# Patient Record
Sex: Male | Born: 1940 | Race: White | Hispanic: No | Smoking: Never smoker
Health system: Southern US, Community
[De-identification: ages and names within clinical notes are randomized; demographics above are authoritative.]

## PROBLEM LIST (undated history)

## (undated) DIAGNOSIS — C801 Malignant (primary) neoplasm, unspecified: Secondary | ICD-10-CM

## (undated) DIAGNOSIS — M1990 Unspecified osteoarthritis, unspecified site: Secondary | ICD-10-CM

## (undated) DIAGNOSIS — I10 Essential (primary) hypertension: Secondary | ICD-10-CM

## (undated) DIAGNOSIS — J189 Pneumonia, unspecified organism: Secondary | ICD-10-CM

## (undated) DIAGNOSIS — R7303 Prediabetes: Secondary | ICD-10-CM

## (undated) HISTORY — PX: CHOLECYSTECTOMY: SHX55

## (undated) HISTORY — PX: KNEE ARTHROPLASTY: SHX992

---

## 2020-01-25 ENCOUNTER — Ambulatory Visit: Payer: Medicare PPO | Attending: Internal Medicine

## 2020-01-25 DIAGNOSIS — Z23 Encounter for immunization: Secondary | ICD-10-CM

## 2020-01-25 NOTE — Progress Notes (Signed)
   Covid-19 Vaccination Clinic  Name:  Mark Hall    MRN: [id number] DOB: 05-13-1941  01/25/2020  Mark Hall was observed post Covid-19 immunization for 15 minutes without incidence. He was provided with Vaccine Information Sheet and instruction to access the V-Safe system.   Mark Hall was instructed to call 911 with any severe reactions post vaccine: Marland Kitchen Difficulty breathing  . Swelling of your face and throat  . A fast heartbeat  . A bad rash all over your body  . Dizziness and weakness    Immunizations Administered    Name Date Dose VIS Date Route   Pfizer COVID-19 Vaccine 01/25/2020 10:04 AM 0.3 mL 12/11/2019 Intramuscular   Manufacturer: [place]   Lot: [id number]   [place]: [id number]

## 2020-02-15 ENCOUNTER — Ambulatory Visit: Payer: Medicare PPO | Attending: Internal Medicine

## 2020-02-15 DIAGNOSIS — Z23 Encounter for immunization: Secondary | ICD-10-CM | POA: Insufficient documentation

## 2020-02-15 NOTE — Progress Notes (Signed)
   Covid-19 Vaccination Clinic  Name:  [name]    MRN: [id number] DOB: 01-09-1941  02/15/2020  [name] was observed post Covid-19 immunization for 15 minutes without incidence. He was provided with Vaccine Information Sheet and instruction to access the V-Safe system.   [name] was instructed to call 911 with any severe reactions post vaccine: Marland Kitchen Difficulty breathing  . Swelling of your face and throat  . A fast heartbeat  . A bad rash all over your body  . Dizziness and weakness    Immunizations Administered    Name Date Dose VIS Date Route   Pfizer COVID-19 Vaccine 02/15/2020 10:21 AM 0.3 mL 12/11/2019 Intramuscular   Manufacturer: [place]   Lot: [id number]   [place]: [id number]

## 2020-07-24 DIAGNOSIS — R4182 Altered mental status, unspecified: Secondary | ICD-10-CM | POA: Diagnosis not present

## 2020-07-24 DIAGNOSIS — J189 Pneumonia, unspecified organism: Secondary | ICD-10-CM | POA: Diagnosis not present

## 2020-07-24 DIAGNOSIS — N3001 Acute cystitis with hematuria: Secondary | ICD-10-CM | POA: Diagnosis not present

## 2020-07-24 DIAGNOSIS — R509 Fever, unspecified: Secondary | ICD-10-CM | POA: Diagnosis not present

## 2020-07-24 DIAGNOSIS — E872 Acidosis: Secondary | ICD-10-CM | POA: Diagnosis not present

## 2020-07-24 DIAGNOSIS — J168 Pneumonia due to other specified infectious organisms: Secondary | ICD-10-CM | POA: Diagnosis not present

## 2020-07-24 DIAGNOSIS — I350 Nonrheumatic aortic (valve) stenosis: Secondary | ICD-10-CM | POA: Diagnosis not present

## 2020-07-24 DIAGNOSIS — J9601 Acute respiratory failure with hypoxia: Secondary | ICD-10-CM | POA: Diagnosis not present

## 2020-07-24 DIAGNOSIS — A419 Sepsis, unspecified organism: Secondary | ICD-10-CM | POA: Diagnosis not present

## 2020-07-24 DIAGNOSIS — R739 Hyperglycemia, unspecified: Secondary | ICD-10-CM | POA: Diagnosis not present

## 2020-07-24 DIAGNOSIS — E7800 Pure hypercholesterolemia, unspecified: Secondary | ICD-10-CM | POA: Diagnosis not present

## 2020-07-24 DIAGNOSIS — R410 Disorientation, unspecified: Secondary | ICD-10-CM | POA: Diagnosis not present

## 2020-07-24 DIAGNOSIS — Z03818 Encounter for observation for suspected exposure to other biological agents ruled out: Secondary | ICD-10-CM | POA: Diagnosis not present

## 2020-07-24 DIAGNOSIS — E785 Hyperlipidemia, unspecified: Secondary | ICD-10-CM | POA: Diagnosis not present

## 2020-07-24 DIAGNOSIS — R05 Cough: Secondary | ICD-10-CM | POA: Diagnosis not present

## 2020-07-24 DIAGNOSIS — R011 Cardiac murmur, unspecified: Secondary | ICD-10-CM | POA: Diagnosis not present

## 2020-07-24 DIAGNOSIS — R791 Abnormal coagulation profile: Secondary | ICD-10-CM | POA: Diagnosis not present

## 2020-07-24 DIAGNOSIS — R42 Dizziness and giddiness: Secondary | ICD-10-CM | POA: Diagnosis not present

## 2020-07-24 DIAGNOSIS — Z20822 Contact with and (suspected) exposure to covid-19: Secondary | ICD-10-CM | POA: Diagnosis not present

## 2020-07-24 DIAGNOSIS — I060 Rheumatic aortic stenosis: Secondary | ICD-10-CM | POA: Diagnosis not present

## 2020-08-08 DIAGNOSIS — J189 Pneumonia, unspecified organism: Secondary | ICD-10-CM | POA: Diagnosis not present

## 2020-08-08 DIAGNOSIS — R718 Other abnormality of red blood cells: Secondary | ICD-10-CM | POA: Diagnosis not present

## 2020-08-08 DIAGNOSIS — R7303 Prediabetes: Secondary | ICD-10-CM | POA: Diagnosis not present

## 2020-08-08 DIAGNOSIS — Z09 Encounter for follow-up examination after completed treatment for conditions other than malignant neoplasm: Secondary | ICD-10-CM | POA: Diagnosis not present

## 2020-08-08 DIAGNOSIS — D649 Anemia, unspecified: Secondary | ICD-10-CM | POA: Diagnosis not present

## 2020-08-10 DIAGNOSIS — J189 Pneumonia, unspecified organism: Secondary | ICD-10-CM | POA: Diagnosis not present

## 2020-08-10 DIAGNOSIS — D649 Anemia, unspecified: Secondary | ICD-10-CM | POA: Diagnosis not present

## 2020-08-10 DIAGNOSIS — R7303 Prediabetes: Secondary | ICD-10-CM | POA: Diagnosis not present

## 2020-08-10 DIAGNOSIS — R718 Other abnormality of red blood cells: Secondary | ICD-10-CM | POA: Diagnosis not present

## 2020-08-10 DIAGNOSIS — Z09 Encounter for follow-up examination after completed treatment for conditions other than malignant neoplasm: Secondary | ICD-10-CM | POA: Diagnosis not present

## 2020-08-16 DIAGNOSIS — L821 Other seborrheic keratosis: Secondary | ICD-10-CM | POA: Diagnosis not present

## 2020-08-16 DIAGNOSIS — L570 Actinic keratosis: Secondary | ICD-10-CM | POA: Diagnosis not present

## 2020-08-16 DIAGNOSIS — Z85828 Personal history of other malignant neoplasm of skin: Secondary | ICD-10-CM | POA: Diagnosis not present

## 2020-08-16 DIAGNOSIS — L578 Other skin changes due to chronic exposure to nonionizing radiation: Secondary | ICD-10-CM | POA: Diagnosis not present

## 2020-08-24 DIAGNOSIS — Z09 Encounter for follow-up examination after completed treatment for conditions other than malignant neoplasm: Secondary | ICD-10-CM | POA: Diagnosis not present

## 2020-08-24 DIAGNOSIS — J189 Pneumonia, unspecified organism: Secondary | ICD-10-CM | POA: Diagnosis not present

## 2020-09-07 DIAGNOSIS — Z0000 Encounter for general adult medical examination without abnormal findings: Secondary | ICD-10-CM | POA: Diagnosis not present

## 2020-09-07 DIAGNOSIS — N529 Male erectile dysfunction, unspecified: Secondary | ICD-10-CM | POA: Diagnosis not present

## 2020-09-07 DIAGNOSIS — R7303 Prediabetes: Secondary | ICD-10-CM | POA: Diagnosis not present

## 2020-09-07 DIAGNOSIS — Z125 Encounter for screening for malignant neoplasm of prostate: Secondary | ICD-10-CM | POA: Diagnosis not present

## 2020-09-07 DIAGNOSIS — E782 Mixed hyperlipidemia: Secondary | ICD-10-CM | POA: Diagnosis not present

## 2020-12-01 DIAGNOSIS — D0462 Carcinoma in situ of skin of left upper limb, including shoulder: Secondary | ICD-10-CM | POA: Diagnosis not present

## 2020-12-01 DIAGNOSIS — L570 Actinic keratosis: Secondary | ICD-10-CM | POA: Diagnosis not present

## 2021-01-17 DIAGNOSIS — Z86008 Personal history of in-situ neoplasm of other site: Secondary | ICD-10-CM | POA: Diagnosis not present

## 2021-01-17 DIAGNOSIS — W908XXS Exposure to other nonionizing radiation, sequela: Secondary | ICD-10-CM | POA: Diagnosis not present

## 2021-01-17 DIAGNOSIS — L570 Actinic keratosis: Secondary | ICD-10-CM | POA: Diagnosis not present

## 2021-01-17 DIAGNOSIS — L578 Other skin changes due to chronic exposure to nonionizing radiation: Secondary | ICD-10-CM | POA: Diagnosis not present

## 2021-02-09 DIAGNOSIS — S0001XA Abrasion of scalp, initial encounter: Secondary | ICD-10-CM | POA: Diagnosis not present

## 2021-02-09 DIAGNOSIS — M542 Cervicalgia: Secondary | ICD-10-CM | POA: Diagnosis not present

## 2021-02-09 DIAGNOSIS — S0003XA Contusion of scalp, initial encounter: Secondary | ICD-10-CM | POA: Diagnosis not present

## 2021-02-09 DIAGNOSIS — R030 Elevated blood-pressure reading, without diagnosis of hypertension: Secondary | ICD-10-CM | POA: Diagnosis not present

## 2021-02-09 DIAGNOSIS — Z23 Encounter for immunization: Secondary | ICD-10-CM | POA: Diagnosis not present

## 2021-02-09 DIAGNOSIS — S0101XA Laceration without foreign body of scalp, initial encounter: Secondary | ICD-10-CM | POA: Diagnosis not present

## 2021-02-09 DIAGNOSIS — S199XXA Unspecified injury of neck, initial encounter: Secondary | ICD-10-CM | POA: Diagnosis not present

## 2021-02-14 DIAGNOSIS — S0083XD Contusion of other part of head, subsequent encounter: Secondary | ICD-10-CM | POA: Diagnosis not present

## 2021-02-14 DIAGNOSIS — S0990XD Unspecified injury of head, subsequent encounter: Secondary | ICD-10-CM | POA: Diagnosis not present

## 2021-03-07 DIAGNOSIS — L570 Actinic keratosis: Secondary | ICD-10-CM | POA: Diagnosis not present

## 2021-03-17 DIAGNOSIS — H02831 Dermatochalasis of right upper eyelid: Secondary | ICD-10-CM | POA: Diagnosis not present

## 2021-03-17 DIAGNOSIS — H43813 Vitreous degeneration, bilateral: Secondary | ICD-10-CM | POA: Diagnosis not present

## 2021-03-17 DIAGNOSIS — H2513 Age-related nuclear cataract, bilateral: Secondary | ICD-10-CM | POA: Diagnosis not present

## 2021-03-17 DIAGNOSIS — C441022 Unspecified malignant neoplasm of skin of right lower eyelid, including canthus: Secondary | ICD-10-CM | POA: Diagnosis not present

## 2021-05-04 DIAGNOSIS — M5432 Sciatica, left side: Secondary | ICD-10-CM | POA: Diagnosis not present

## 2021-05-18 DIAGNOSIS — Z20822 Contact with and (suspected) exposure to covid-19: Secondary | ICD-10-CM | POA: Diagnosis not present

## 2021-05-30 DIAGNOSIS — R0982 Postnasal drip: Secondary | ICD-10-CM | POA: Diagnosis not present

## 2021-07-06 DIAGNOSIS — C44712 Basal cell carcinoma of skin of right lower limb, including hip: Secondary | ICD-10-CM | POA: Diagnosis not present

## 2021-07-06 DIAGNOSIS — L570 Actinic keratosis: Secondary | ICD-10-CM | POA: Diagnosis not present

## 2021-07-06 DIAGNOSIS — Z85828 Personal history of other malignant neoplasm of skin: Secondary | ICD-10-CM | POA: Diagnosis not present

## 2021-07-06 DIAGNOSIS — L578 Other skin changes due to chronic exposure to nonionizing radiation: Secondary | ICD-10-CM | POA: Diagnosis not present

## 2021-07-06 DIAGNOSIS — Z86008 Personal history of in-situ neoplasm of other site: Secondary | ICD-10-CM | POA: Diagnosis not present

## 2021-07-06 DIAGNOSIS — L821 Other seborrheic keratosis: Secondary | ICD-10-CM | POA: Diagnosis not present

## 2021-07-13 DIAGNOSIS — Z96651 Presence of right artificial knee joint: Secondary | ICD-10-CM | POA: Diagnosis not present

## 2021-07-13 DIAGNOSIS — M1712 Unilateral primary osteoarthritis, left knee: Secondary | ICD-10-CM | POA: Diagnosis not present

## 2021-09-14 DIAGNOSIS — Z23 Encounter for immunization: Secondary | ICD-10-CM | POA: Diagnosis not present

## 2021-09-14 DIAGNOSIS — E782 Mixed hyperlipidemia: Secondary | ICD-10-CM | POA: Diagnosis not present

## 2021-09-14 DIAGNOSIS — R7303 Prediabetes: Secondary | ICD-10-CM | POA: Diagnosis not present

## 2021-09-14 DIAGNOSIS — E559 Vitamin D deficiency, unspecified: Secondary | ICD-10-CM | POA: Diagnosis not present

## 2021-09-14 DIAGNOSIS — Z0000 Encounter for general adult medical examination without abnormal findings: Secondary | ICD-10-CM | POA: Diagnosis not present

## 2021-09-14 DIAGNOSIS — I10 Essential (primary) hypertension: Secondary | ICD-10-CM | POA: Diagnosis not present

## 2021-09-14 DIAGNOSIS — N529 Male erectile dysfunction, unspecified: Secondary | ICD-10-CM | POA: Diagnosis not present

## 2021-09-14 DIAGNOSIS — Z125 Encounter for screening for malignant neoplasm of prostate: Secondary | ICD-10-CM | POA: Diagnosis not present

## 2021-09-20 DIAGNOSIS — R220 Localized swelling, mass and lump, head: Secondary | ICD-10-CM | POA: Diagnosis not present

## 2021-12-12 DIAGNOSIS — M79605 Pain in left leg: Secondary | ICD-10-CM | POA: Diagnosis not present

## 2021-12-13 ENCOUNTER — Ambulatory Visit
Admission: RE | Admit: 2021-12-13 | Discharge: 2021-12-13 | Disposition: A | Discharge status: Home / Self Care | Payer: Medicare PPO | Source: Ambulatory Visit | Attending: Sports Medicine | Admitting: Sports Medicine

## 2021-12-13 ENCOUNTER — Other Ambulatory Visit: Payer: Self-pay | Admitting: Sports Medicine

## 2021-12-13 ENCOUNTER — Other Ambulatory Visit: Payer: Self-pay

## 2021-12-13 DIAGNOSIS — M25552 Pain in left hip: Secondary | ICD-10-CM

## 2021-12-20 DIAGNOSIS — M1612 Unilateral primary osteoarthritis, left hip: Secondary | ICD-10-CM | POA: Diagnosis not present

## 2022-01-08 DIAGNOSIS — M1612 Unilateral primary osteoarthritis, left hip: Secondary | ICD-10-CM | POA: Diagnosis not present

## 2022-01-08 DIAGNOSIS — M1712 Unilateral primary osteoarthritis, left knee: Secondary | ICD-10-CM | POA: Diagnosis not present

## 2022-01-11 DIAGNOSIS — L578 Other skin changes due to chronic exposure to nonionizing radiation: Secondary | ICD-10-CM | POA: Diagnosis not present

## 2022-01-11 DIAGNOSIS — L570 Actinic keratosis: Secondary | ICD-10-CM | POA: Diagnosis not present

## 2022-01-11 DIAGNOSIS — Z85828 Personal history of other malignant neoplasm of skin: Secondary | ICD-10-CM | POA: Diagnosis not present

## 2022-01-11 DIAGNOSIS — L820 Inflamed seborrheic keratosis: Secondary | ICD-10-CM | POA: Diagnosis not present

## 2022-01-11 DIAGNOSIS — L821 Other seborrheic keratosis: Secondary | ICD-10-CM | POA: Diagnosis not present

## 2022-01-11 DIAGNOSIS — Z86008 Personal history of in-situ neoplasm of other site: Secondary | ICD-10-CM | POA: Diagnosis not present

## 2022-01-18 DIAGNOSIS — M1712 Unilateral primary osteoarthritis, left knee: Secondary | ICD-10-CM | POA: Diagnosis not present

## 2022-03-19 DIAGNOSIS — R7303 Prediabetes: Secondary | ICD-10-CM | POA: Diagnosis not present

## 2022-03-19 DIAGNOSIS — M1712 Unilateral primary osteoarthritis, left knee: Secondary | ICD-10-CM | POA: Diagnosis not present

## 2022-03-19 DIAGNOSIS — Z01818 Encounter for other preprocedural examination: Secondary | ICD-10-CM | POA: Diagnosis not present

## 2022-03-19 DIAGNOSIS — M150 Primary generalized (osteo)arthritis: Secondary | ICD-10-CM | POA: Diagnosis not present

## 2022-03-19 DIAGNOSIS — I10 Essential (primary) hypertension: Secondary | ICD-10-CM | POA: Diagnosis not present

## 2022-04-05 DIAGNOSIS — D23112 Other benign neoplasm of skin of right lower eyelid, including canthus: Secondary | ICD-10-CM | POA: Diagnosis not present

## 2022-04-05 DIAGNOSIS — H2513 Age-related nuclear cataract, bilateral: Secondary | ICD-10-CM | POA: Diagnosis not present

## 2022-04-05 DIAGNOSIS — H43813 Vitreous degeneration, bilateral: Secondary | ICD-10-CM | POA: Diagnosis not present

## 2022-04-05 DIAGNOSIS — H02821 Cysts of right upper eyelid: Secondary | ICD-10-CM | POA: Diagnosis not present

## 2022-04-27 ENCOUNTER — Encounter: Payer: Self-pay | Admitting: Orthopedic Surgery

## 2022-04-27 NOTE — H&P (Signed)
?TOTAL KNEE ADMISSION H&P ? ?Patient is being admitted for left total knee arthroplasty. ? ?Subjective: ? ?Chief Complaint: Left knee pain. ? ?HPI: Mark Hall, 81 y.o. male has a history of pain and functional disability in the left knee due to arthritis and has failed non-surgical conservative treatments for greater than 12 weeks to include corticosteriod injections and activity modification. Onset of symptoms was gradual, starting  several  years ago with gradually worsening course since that time. The patient noted no past surgery on the left knee.  Patient currently rates pain in the left knee at 8 out of 10 with activity. Patient has night pain, worsening of pain with activity and weight bearing, pain with passive range of motion, and crepitus. Patient has evidence of bone-on-bone arthritis in the medial compartment and near bone-on-bone arthritis in the patellofemoral compartment by imaging studies. There is no active infection. ? ?There are no problems to display for this patient. ? ? ?History reviewed. No pertinent past medical history. ? ?History reviewed. No pertinent surgical history. ? ?Prior to Admission medications   ?Not on File  ? ? ?Not on File ? ?Social History  ? ?Socioeconomic History  ? Marital status: Unknown  ?  Spouse name: Not on file  ? Number of children: Not on file  ? Years of education: Not on file  ? Highest education level: Not on file  ?Occupational History  ? Not on file  ?Tobacco Use  ? Smoking status: Not on file  ? Smokeless tobacco: Not on file  ?Substance and Sexual Activity  ? Alcohol use: Not on file  ? Drug use: Not on file  ? Sexual activity: Not on file  ?Other Topics Concern  ? Not on file  ?Social History Narrative  ? Not on file  ? ?Social Determinants of Health  ? ?Financial Resource Strain: Not on file  ?Food Insecurity: Not on file  ?Transportation Needs: Not on file  ?Physical Activity: Not on file  ?Stress: Not on file  ?Social Connections: Not on file   ?Intimate Partner Violence: Not on file  ? ? ?Tobacco Use: Not on file  ? ?Social History  ? ?Substance and Sexual Activity  ?Alcohol Use None  ? ? ?History reviewed. No pertinent family history. ? ?Review of Systems  ?Constitutional:  Negative for chills and fever.  ?HENT:  Negative for congestion, sore throat and tinnitus.   ?Eyes:  Negative for double vision, photophobia and pain.  ?Respiratory:  Negative for cough, shortness of breath and wheezing.   ?Cardiovascular:  Negative for chest pain, palpitations and orthopnea.  ?Gastrointestinal:  Negative for heartburn, nausea and vomiting.  ?Genitourinary:  Negative for dysuria, frequency and urgency.  ?Musculoskeletal:  Positive for joint pain.  ?Neurological:  Negative for dizziness, weakness and headaches.  ? ?Objective: ? ?Physical Exam: ?Well nourished and well developed.  ?General: Alert and oriented x3, cooperative and pleasant, no acute distress.  ?Head: normocephalic, atraumatic, neck supple.  ?Eyes: EOMI.  ?Musculoskeletal: ? ?Left Knee Exam:  ? No effusion.  ? Range of motion is 5-115 degrees.  ? Moderate crepitus on range of motion of the knee.  ? Positive medial joint line tenderness.  ? No lateral joint line tenderness.  ? Stable knee. ? ?Calves soft and nontender. Motor function intact in LE. Strength 5/5 LE bilaterally. ?Neuro: Distal pulses 2+. Sensation to light touch intact in LE. ? ?Imaging Review ?Plain radiographs demonstrate severe degenerative joint disease of the left knee. The overall alignment is  neutral. The bone quality appears to be adequate for age and reported activity level. ? ?Assessment/Plan: ? ?End stage arthritis, left knee  ? ?The patient history, physical examination, clinical judgment of the provider and imaging studies are consistent with end stage degenerative joint disease of the left knee and total knee arthroplasty is deemed medically necessary. The treatment options including medical management, injection therapy  arthroscopy and arthroplasty were discussed at length. The risks and benefits of total knee arthroplasty were presented and reviewed. The risks due to aseptic loosening, infection, stiffness, patella tracking problems, thromboembolic complications and other imponderables were discussed. The patient acknowledged the explanation, agreed to proceed with the plan and consent was signed. Patient is being admitted for inpatient treatment for surgery, pain control, PT, OT, prophylactic antibiotics, VTE prophylaxis, progressive ambulation and ADLs and discharge planning. The patient is planning to be discharged  home . ? ? ?Patient's anticipated LOS is less than 2 midnights, meeting these requirements: ?- Lives within 1 hour of care ?- Has a competent adult at home to recover with post-op recover ?- NO history of ? - Chronic pain requiring opioids ? - Diabetes ? - Coronary Artery Disease ? - Heart failure ? - Heart attack ? - Stroke ? - DVT/VTE ? - Cardiac arrhythmia ? - Respiratory Failure/COPD ? - Renal failure ? - Anemia ? - Advanced Liver disease ? ?Therapy Plans: Outpatient therapy at [facility]) ?Disposition: Home with wife ?Planned DVT Prophylaxis: Aspirin 325 mg BID ?DME Needed: None ?PCP: Genelle Bal, [name] (clearance received) ?TXA: IV ?Allergies: Sulfa ?Anesthesia Concerns: None ?BMI: 24.7 ?Last HgbA1c: Not diabetic.  ?Pharmacy: [facility]) ? ? ?- Patient was instructed on what medications to stop prior to surgery. ?- Follow-up visit in 2 weeks with Dr. Wynelle Link ?- Begin physical therapy following surgery ?- Pre-operative lab work as pre-surgical testing ?- Prescriptions will be provided in hospital at time of discharge ? ?Theresa Duty, PA-C ?Orthopedic Surgery ?[facility] [place] ? ? ?

## 2022-05-08 NOTE — Patient Instructions (Signed)
DUE TO COVID-19 ONLY TWO VISITORS  (aged 81 and older)  ARE ALLOWED TO COME WITH YOU AND STAY IN THE WAITING ROOM ONLY DURING PRE OP AND PROCEDURE.   ?**NO VISITORS ARE ALLOWED IN THE SHORT STAY AREA OR RECOVERY ROOM!!** ? ?IF YOU WILL BE ADMITTED INTO THE HOSPITAL YOU ARE ALLOWED ONLY FOUR SUPPORT PEOPLE DURING VISITATION HOURS ONLY (7 AM -8PM)   ?The support person(s) must pass our screening, gel in and out, and wear a mask at all times, including in the patient?s room. ?Patients must also wear a mask when staff or their support person are in the room. ?Visitors GUEST BADGE MUST BE WORN VISIBLY  ?One adult visitor may remain with you overnight and MUST be in the room by 8 P.M. ?  ? ? Your procedure is scheduled on: 05/21/22 ? ? Report to [facility] Entrance ? ?  Report to admitting at : 11:45 AM ? ? Call this number if you have problems the morning of surgery [phone] ? ? Do not eat food :After Midnight. ? ? After Midnight you may have the following liquids until : 10:30 AM  DAY OF SURGERY ? ?Water ?Black Coffee (sugar ok, NO MILK/CREAM OR CREAMERS)  ?Tea (sugar ok, NO MILK/CREAM OR CREAMERS) regular and decaf                             ?Plain Jell-O (NO RED)                                           ?Fruit ices (not with fruit pulp, NO RED)                                     ?Popsicles (NO RED)                                                                  ?Juice: apple, WHITE grape, WHITE cranberry ?Sports drinks like Gatorade (NO RED) ?Clear broth(vegetable,chicken,beef) ? ?Drink  Ensure drink AT :10:30 AM the day of surgery.   ?  ?The day of surgery:  ?Drink ONE (1) Pre-Surgery Clear Ensure or G2 at AM the morning of surgery. Drink in one sitting. Do not sip.  ?This drink was given to you during your hospital  ?pre-op appointment visit. ?Nothing else to drink after completing the  ?Pre-Surgery Clear Ensure or G2. ?  ?       If you have questions, please contact your surgeon?s office.  ?   ?Oral Hygiene is also important to reduce your risk of infection.                                    ?Remember - BRUSH YOUR TEETH THE MORNING OF SURGERY WITH YOUR REGULAR TOOTHPASTE ? ? Do NOT smoke after Midnight ? ? Take these medicines the morning of surgery with A SIP OF WATER: Tylenol as needed. ? ?DO NOT  TAKE ANY ORAL DIABETIC MEDICATIONS DAY OF YOUR SURGERY ? ?Bring CPAP mask and tubing day of surgery. ?                  ?           You may not have any metal on your body including hair pins, jewelry, and body piercing ? ?           Do not wear  lotions, powders, perfumes/cologne, or deodorant ?             ?Men may shave face and neck. ? ? Do not bring valuables to the hospital. [facility] NOT ?            RESPONSIBLE   FOR VALUABLES. ? ? Contacts, dentures or bridgework may not be worn into surgery. ? ? Bring small overnight bag day of surgery. ?  ? Patients discharged on the day of surgery will not be allowed to drive home.  Someone NEEDS to stay with you for the first 24 hours after anesthesia. ? ? Special Instructions: Bring a copy of your healthcare power of attorney and living will documents         the day of surgery if you haven't scanned them before. ? ?            Please read over the following fact sheets you were given: IF [address] [phone] ? ?   [place] - Preparing for Surgery ?Before surgery, you can play an important role.  Because skin is not sterile, your skin needs to be as free of germs as possible.  You can reduce the number of germs on your skin by washing with CHG (chlorahexidine gluconate) soap before surgery.  CHG is an antiseptic cleaner which kills germs and bonds with the skin to continue killing germs even after washing. ?Please DO NOT use if you have an allergy to CHG or antibacterial soaps.  If your skin becomes reddened/irritated stop using the CHG and inform your nurse when you arrive at Short Stay. ?Do not  shave (including legs and underarms) for at least 48 hours prior to the first CHG shower.  You may shave your face/neck. ?Please follow these instructions carefully: ? 1.  Shower with CHG Soap the night before surgery and the  morning of Surgery. ? 2.  If you choose to wash your hair, wash your hair first as usual with your  normal  shampoo. ? 3.  After you shampoo, rinse your hair and body thoroughly to remove the  shampoo.                           4.  Use CHG as you would any other liquid soap.  You can apply chg directly  to the skin and wash  ?                     Gently with a scrungie or clean washcloth. ? 5.  Apply the CHG Soap to your body ONLY FROM THE NECK DOWN.   Do not use on face/ open      ?                     Wound or open sores. Avoid contact with eyes, ears mouth and genitals (private parts).  ?  Wash face,  Genitals (private parts) with your normal soap. ?            6.  Wash thoroughly, paying special attention to the area where your surgery  will be performed. ? 7.  Thoroughly rinse your body with warm water from the neck down. ? 8.  DO NOT shower/wash with your normal soap after using and rinsing off  the CHG Soap. ?               9.  Pat yourself dry with a clean towel. ?           10.  Wear clean pajamas. ?           11.  Place clean sheets on your bed the night of your first shower and do not  sleep with pets. ?Day of Surgery : ?Do not apply any lotions/deodorants the morning of surgery.  Please wear clean clothes to the hospital/surgery center. ? ?FAILURE TO FOLLOW THESE INSTRUCTIONS MAY RESULT IN THE CANCELLATION OF YOUR SURGERY ?PATIENT SIGNATURE_________________________________ ? ?NURSE SIGNATURE__________________________________ ? ?________________________________________________________________________  ? ?Incentive Spirometer ? ?An incentive spirometer is a tool that can help keep your lungs clear and active. This tool measures how well you are filling your lungs  with each breath. Taking long deep breaths may help reverse or decrease the chance of developing breathing (pulmonary) problems (especially infection) following: ?A long period of time when you are unable to move or be active. ?BEFORE THE PROCEDURE  ?If the spirometer includes an indicator to show your best effort, your nurse or respiratory therapist will set it to a desired goal. ?If possible, sit up straight or lean slightly forward. Try not to slouch. ?Hold the incentive spirometer in an upright position. ?INSTRUCTIONS FOR USE  ?Sit on the edge of your bed if possible, or sit up as far as you can in bed or on a chair. ?Hold the incentive spirometer in an upright position. ?Breathe out normally. ?Place the mouthpiece in your mouth and seal your lips tightly around it. ?Breathe in slowly and as deeply as possible, raising the piston or the ball toward the top of the column. ?Hold your breath for 3-5 seconds or for as long as possible. Allow the piston or ball to fall to the bottom of the column. ?Remove the mouthpiece from your mouth and breathe out normally. ?Rest for a few seconds and repeat Steps 1 through 7 at least 10 times every 1-2 hours when you are awake. Take your time and take a few normal breaths between deep breaths. ?The spirometer may include an indicator to show your best effort. Use the indicator as a goal to work toward during each repetition. ?After each set of 10 deep breaths, practice coughing to be sure your lungs are clear. If you have an incision (the cut made at the time of surgery), support your incision when coughing by placing a pillow or rolled up towels firmly against it. ?Once you are able to get out of bed, walk around indoors and cough well. You may stop using the incentive spirometer when instructed by your caregiver.  ?RISKS AND COMPLICATIONS ?Take your time so you do not get dizzy or light-headed. ?If you are in pain, you may need to take or ask for pain medication before doing  incentive spirometry. It is harder to take a deep breath if you are having pain. ?AFTER USE ?Rest and breathe slowly and easily. ?It can be helpful to keep track  of a log of your progress. Your caregiver can

## 2022-05-09 ENCOUNTER — Encounter (HOSPITAL_COMMUNITY)
Admission: RE | Admit: 2022-05-09 | Discharge: 2022-05-09 | Disposition: A | Discharge status: Home / Self Care | Payer: Medicare PPO | Source: Ambulatory Visit | Attending: Orthopedic Surgery | Admitting: Orthopedic Surgery

## 2022-05-09 ENCOUNTER — Other Ambulatory Visit: Payer: Self-pay

## 2022-05-09 ENCOUNTER — Encounter (HOSPITAL_COMMUNITY): Payer: Self-pay

## 2022-05-09 VITALS — BP 160/93 | HR 75 | Temp 97.6°F | Resp 18 | Ht 69.0 in | Wt 161.2 lb

## 2022-05-09 DIAGNOSIS — I2510 Atherosclerotic heart disease of native coronary artery without angina pectoris: Secondary | ICD-10-CM

## 2022-05-09 DIAGNOSIS — M1712 Unilateral primary osteoarthritis, left knee: Secondary | ICD-10-CM | POA: Diagnosis not present

## 2022-05-09 DIAGNOSIS — Z01818 Encounter for other preprocedural examination: Secondary | ICD-10-CM | POA: Insufficient documentation

## 2022-05-09 HISTORY — DX: Malignant (primary) neoplasm, unspecified: C80.1

## 2022-05-09 HISTORY — DX: Unspecified osteoarthritis, unspecified site: M19.90

## 2022-05-09 HISTORY — DX: Prediabetes: R73.03

## 2022-05-09 HISTORY — DX: Pneumonia, unspecified organism: J18.9

## 2022-05-09 HISTORY — DX: Essential (primary) hypertension: I10

## 2022-05-09 LAB — COMPREHENSIVE METABOLIC PANEL
ALT: 20 U/L (ref 0–44)
AST: 25 U/L (ref 15–41)
Albumin: 4.2 g/dL (ref 3.5–5.0)
Alkaline Phosphatase: 43 U/L (ref 38–126)
Anion gap: 6 (ref 5–15)
BUN: 16 mg/dL (ref 8–23)
CO2: 27 mmol/L (ref 22–32)
Calcium: 9.2 mg/dL (ref 8.9–10.3)
Chloride: 97 mmol/L — ABNORMAL LOW (ref 98–111)
Creatinine, Ser: 0.81 mg/dL (ref 0.61–1.24)
GFR, Estimated: >60 mL/min (ref >60)
Glucose, Bld: 98 mg/dL (ref 70–99)
Potassium: 4.8 mmol/L (ref 3.5–5.1)
Sodium: 130 mmol/L — ABNORMAL LOW (ref 135–145)
Total Bilirubin: 0.8 mg/dL (ref 0.3–1.2)
Total Protein: 7.3 g/dL (ref 6.5–8.1)

## 2022-05-09 LAB — SURGICAL PCR SCREEN
MRSA, PCR: NEGATIVE
Staphylococcus aureus: NEGATIVE

## 2022-05-09 LAB — CBC
HCT: 44.6 % (ref 39.0–52.0)
Hemoglobin: 14.2 g/dL (ref 13.0–17.0)
MCH: 31.7 pg (ref 26.0–34.0)
MCHC: 31.8 g/dL (ref 30.0–36.0)
MCV: 99.6 fL (ref 80.0–100.0)
Platelets: 272 10*3/uL (ref 150–400)
RBC: 4.48 MIL/uL (ref 4.22–5.81)
RDW: 12.4 % (ref 11.5–15.5)
WBC: 9.1 10*3/uL (ref 4.0–10.5)
nRBC: 0 % (ref 0.0–0.2)

## 2022-05-09 NOTE — Progress Notes (Addendum)
For Short Stay: ?[facility] appointment date: ?Date of COVID positive in last 90 days: ? ?Bowel Prep reminder: ? ? ?For Anesthesia: ?PCP - Jillyn Ledger: [name]: Clearance: 04/02/22: Chart ?Cardiologist -  ? ?Chest x-ray -  ?EKG - 03/19/22 ?Stress Test -  ?ECHO -  ?Cardiac Cath -  ?Pacemaker/ICD device last checked: ?Pacemaker orders received: ?Device Rep notified: ? ?Spinal Cord Stimulator: ? ?Sleep Study -  ?CPAP -  ? ?Fasting Blood Sugar -  ?Checks Blood Sugar _____ times a day ?Date and result of last Hgb A1c- ? ?Blood Thinner Instructions: ?Aspirin Instructions: ?Last Dose: ? ?Activity level: Can go up a flight of stairs and activities of daily living without stopping and without chest pain and/or shortness of breath ?  Able to exercise without chest pain and/or shortness of breath ?  Unable to go up a flight of stairs without chest pain and/or shortness of breath ?   ? ?Anesthesia review: Hx: HTN ? ?Patient denies shortness of breath, fever, cough and chest pain at PAT appointment ? ? ?Patient verbalized understanding of instructions that were given to them at the PAT appointment. Patient was also instructed that they will need to review over the PAT instructions again at home before surgery.  ?

## 2022-05-09 NOTE — Progress Notes (Addendum)
CMP done 05/09/2022 routed to DR Aluiso.  ?Shawn Stall aware of Sodium result on 05/09/22.  ?

## 2022-05-21 ENCOUNTER — Other Ambulatory Visit: Payer: Self-pay

## 2022-05-21 ENCOUNTER — Encounter (HOSPITAL_COMMUNITY)
Admission: RE | Disposition: A | Discharge status: Home / Self Care | Payer: Self-pay | Source: Ambulatory Visit | Attending: Orthopedic Surgery

## 2022-05-21 ENCOUNTER — Encounter (HOSPITAL_COMMUNITY): Payer: Self-pay | Admitting: Orthopedic Surgery

## 2022-05-21 ENCOUNTER — Observation Stay (HOSPITAL_COMMUNITY)
Admission: RE | Admit: 2022-05-21 | Discharge: 2022-05-22 | Disposition: A | Discharge status: Home / Self Care | Payer: Medicare PPO | Source: Ambulatory Visit | Attending: Orthopedic Surgery | Admitting: Orthopedic Surgery

## 2022-05-21 ENCOUNTER — Ambulatory Visit (HOSPITAL_COMMUNITY): Payer: Medicare PPO | Admitting: Physician Assistant

## 2022-05-21 ENCOUNTER — Ambulatory Visit (HOSPITAL_BASED_OUTPATIENT_CLINIC_OR_DEPARTMENT_OTHER): Payer: Medicare PPO | Admitting: Anesthesiology

## 2022-05-21 DIAGNOSIS — M1712 Unilateral primary osteoarthritis, left knee: Secondary | ICD-10-CM

## 2022-05-21 DIAGNOSIS — M179 Osteoarthritis of knee, unspecified: Secondary | ICD-10-CM | POA: Diagnosis present

## 2022-05-21 DIAGNOSIS — Z859 Personal history of malignant neoplasm, unspecified: Secondary | ICD-10-CM | POA: Diagnosis not present

## 2022-05-21 DIAGNOSIS — I10 Essential (primary) hypertension: Secondary | ICD-10-CM | POA: Diagnosis not present

## 2022-05-21 DIAGNOSIS — G8918 Other acute postprocedural pain: Secondary | ICD-10-CM | POA: Diagnosis not present

## 2022-05-21 HISTORY — PX: TOTAL KNEE ARTHROPLASTY: SHX125

## 2022-05-21 SURGERY — ARTHROPLASTY, KNEE, TOTAL
Anesthesia: Spinal | Site: Knee | Laterality: Left

## 2022-05-21 MED ORDER — METOCLOPRAMIDE HCL 5 MG PO TABS: 5.0000 mg | ORAL_TABLET | Freq: Three times a day (TID) | ORAL | Status: DC | PRN

## 2022-05-21 MED ORDER — SIMVASTATIN 20 MG PO TABS
10.0000 mg | ORAL_TABLET | Freq: Every evening | ORAL | Status: DC
  Administered 2022-05-21: 10 mg via ORAL
  Filled 2022-05-21: qty 1

## 2022-05-21 MED ORDER — ACETAMINOPHEN 325 MG PO TABS: 325.0000 mg | ORAL_TABLET | Freq: Once | ORAL | Status: DC | PRN

## 2022-05-21 MED ORDER — MENTHOL 3 MG MT LOZG: 1.0000 | LOZENGE | OROMUCOSAL | Status: DC | PRN

## 2022-05-21 MED ORDER — FENTANYL CITRATE (PF) 100 MCG/2ML IJ SOLN
INTRAMUSCULAR | Status: AC
  Filled 2022-05-21: qty 2

## 2022-05-21 MED ORDER — ONDANSETRON HCL 4 MG/2ML IJ SOLN
INTRAMUSCULAR | Status: DC | PRN
Start: 2022-05-21 — End: 2022-05-21
  Administered 2022-05-21: 4 mg via INTRAVENOUS

## 2022-05-21 MED ORDER — CEFAZOLIN SODIUM-DEXTROSE 2-4 GM/100ML-% IV SOLN
2.0000 g | Freq: Four times a day (QID) | INTRAVENOUS | Status: AC
  Administered 2022-05-21 – 2022-05-22 (×2): 2 g via INTRAVENOUS
  Filled 2022-05-21 (×2): qty 100

## 2022-05-21 MED ORDER — POVIDONE-IODINE 10 % EX SWAB
2.0000 "application " | Freq: Once | CUTANEOUS | Status: AC
  Administered 2022-05-21: 2 via TOPICAL

## 2022-05-21 MED ORDER — ACETAMINOPHEN 500 MG PO TABS
1000.0000 mg | ORAL_TABLET | Freq: Four times a day (QID) | ORAL | Status: AC
  Administered 2022-05-21 – 2022-05-22 (×4): 1000 mg via ORAL
  Filled 2022-05-21 (×4): qty 2

## 2022-05-21 MED ORDER — CEFAZOLIN SODIUM-DEXTROSE 2-4 GM/100ML-% IV SOLN
2.0000 g | INTRAVENOUS | Status: AC
  Administered 2022-05-21: 2 g via INTRAVENOUS
  Filled 2022-05-21: qty 100

## 2022-05-21 MED ORDER — BUPIVACAINE IN DEXTROSE 0.75-8.25 % IT SOLN
INTRATHECAL | Status: DC | PRN
  Administered 2022-05-21: 1.6 mL via INTRATHECAL

## 2022-05-21 MED ORDER — ACETAMINOPHEN 160 MG/5ML PO SOLN: 325.0000 mg | Freq: Once | ORAL | Status: DC | PRN

## 2022-05-21 MED ORDER — SODIUM CHLORIDE 0.9 % IR SOLN
Status: DC | PRN
  Administered 2022-05-21 (×2): 1000 mL

## 2022-05-21 MED ORDER — MEPERIDINE HCL 50 MG/ML IJ SOLN: 6.2500 mg | INTRAMUSCULAR | Status: DC | PRN

## 2022-05-21 MED ORDER — ASPIRIN 325 MG PO TBEC
325.0000 mg | DELAYED_RELEASE_TABLET | Freq: Two times a day (BID) | ORAL | Status: DC
  Administered 2022-05-22: 325 mg via ORAL
  Filled 2022-05-21: qty 1

## 2022-05-21 MED ORDER — BUPIVACAINE LIPOSOME 1.3 % IJ SUSP
INTRAMUSCULAR | Status: AC
  Filled 2022-05-21: qty 20

## 2022-05-21 MED ORDER — PROPOFOL 500 MG/50ML IV EMUL
INTRAVENOUS | Status: DC | PRN
  Administered 2022-05-21: 75 ug/kg/min via INTRAVENOUS

## 2022-05-21 MED ORDER — ACETAMINOPHEN 10 MG/ML IV SOLN
1000.0000 mg | Freq: Four times a day (QID) | INTRAVENOUS | Status: DC
  Administered 2022-05-21: 1000 mg via INTRAVENOUS
  Filled 2022-05-21: qty 100

## 2022-05-21 MED ORDER — FENTANYL CITRATE (PF) 100 MCG/2ML IJ SOLN
INTRAMUSCULAR | Status: DC | PRN
Start: 2022-05-21 — End: 2022-05-21
  Administered 2022-05-21: 100 ug via INTRAVENOUS

## 2022-05-21 MED ORDER — SODIUM CHLORIDE (PF) 0.9 % IJ SOLN
INTRAMUSCULAR | Status: DC | PRN
  Administered 2022-05-21: 60 mL

## 2022-05-21 MED ORDER — BISACODYL 10 MG RE SUPP
10.0000 mg | Freq: Every day | RECTAL | Status: DC | PRN
Start: 2022-05-21 — End: 2022-05-22

## 2022-05-21 MED ORDER — TRANEXAMIC ACID-NACL 1000-0.7 MG/100ML-% IV SOLN
1000.0000 mg | INTRAVENOUS | Status: AC
  Administered 2022-05-21: 1000 mg via INTRAVENOUS
  Filled 2022-05-21: qty 100

## 2022-05-21 MED ORDER — PHENYLEPHRINE HCL-NACL 20-0.9 MG/250ML-% IV SOLN
INTRAVENOUS | Status: DC | PRN
  Administered 2022-05-21: 40 ug/min via INTRAVENOUS

## 2022-05-21 MED ORDER — BUPIVACAINE LIPOSOME 1.3 % IJ SUSP: 20.0000 mL | Freq: Once | INTRAMUSCULAR | Status: DC

## 2022-05-21 MED ORDER — MORPHINE SULFATE (PF) 2 MG/ML IV SOLN: 1.0000 mg | INTRAVENOUS | Status: DC | PRN

## 2022-05-21 MED ORDER — TRAMADOL HCL 50 MG PO TABS: 50.0000 mg | ORAL_TABLET | Freq: Four times a day (QID) | ORAL | Status: DC | PRN

## 2022-05-21 MED ORDER — DEXAMETHASONE SODIUM PHOSPHATE 10 MG/ML IJ SOLN
8.0000 mg | Freq: Once | INTRAMUSCULAR | Status: AC
  Administered 2022-05-21: 8 mg via INTRAVENOUS

## 2022-05-21 MED ORDER — BUPIVACAINE LIPOSOME 1.3 % IJ SUSP
INTRAMUSCULAR | Status: DC | PRN
  Administered 2022-05-21: 20 mL

## 2022-05-21 MED ORDER — METHOCARBAMOL 500 MG PO TABS
500.0000 mg | ORAL_TABLET | Freq: Four times a day (QID) | ORAL | Status: DC | PRN
  Administered 2022-05-22 (×2): 500 mg via ORAL
  Filled 2022-05-21 (×2): qty 1

## 2022-05-21 MED ORDER — SODIUM CHLORIDE (PF) 0.9 % IJ SOLN
INTRAMUSCULAR | Status: AC
  Filled 2022-05-21: qty 10

## 2022-05-21 MED ORDER — PHENYLEPHRINE HCL (PRESSORS) 10 MG/ML IV SOLN
INTRAVENOUS | Status: AC
  Filled 2022-05-21: qty 1

## 2022-05-21 MED ORDER — DEXAMETHASONE SODIUM PHOSPHATE 10 MG/ML IJ SOLN
10.0000 mg | Freq: Once | INTRAMUSCULAR | Status: AC
  Administered 2022-05-22: 10 mg via INTRAVENOUS
  Filled 2022-05-21: qty 1

## 2022-05-21 MED ORDER — ONDANSETRON HCL 4 MG PO TABS: 4.0000 mg | ORAL_TABLET | Freq: Four times a day (QID) | ORAL | Status: DC | PRN

## 2022-05-21 MED ORDER — DOCUSATE SODIUM 100 MG PO CAPS
100.0000 mg | ORAL_CAPSULE | Freq: Two times a day (BID) | ORAL | Status: DC
  Administered 2022-05-21 – 2022-05-22 (×2): 100 mg via ORAL
  Filled 2022-05-21 (×2): qty 1

## 2022-05-21 MED ORDER — ONDANSETRON HCL 4 MG/2ML IJ SOLN: 4.0000 mg | Freq: Four times a day (QID) | INTRAMUSCULAR | Status: DC | PRN

## 2022-05-21 MED ORDER — POLYETHYLENE GLYCOL 3350 17 G PO PACK: 17.0000 g | PACK | Freq: Every day | ORAL | Status: DC | PRN

## 2022-05-21 MED ORDER — AMISULPRIDE (ANTIEMETIC) 5 MG/2ML IV SOLN: 10.0000 mg | Freq: Once | INTRAVENOUS | Status: DC | PRN

## 2022-05-21 MED ORDER — ACETAMINOPHEN 10 MG/ML IV SOLN: 1000.0000 mg | Freq: Once | INTRAVENOUS | Status: DC | PRN

## 2022-05-21 MED ORDER — ORAL CARE MOUTH RINSE: 15.0000 mL | Freq: Once | OROMUCOSAL | Status: AC

## 2022-05-21 MED ORDER — SODIUM CHLORIDE (PF) 0.9 % IJ SOLN
INTRAMUSCULAR | Status: AC
  Filled 2022-05-21: qty 50

## 2022-05-21 MED ORDER — CHLORHEXIDINE GLUCONATE 0.12 % MT SOLN
15.0000 mL | Freq: Once | OROMUCOSAL | Status: AC
  Administered 2022-05-21: 15 mL via OROMUCOSAL

## 2022-05-21 MED ORDER — PHENOL 1.4 % MT LIQD: 1.0000 | OROMUCOSAL | Status: DC | PRN

## 2022-05-21 MED ORDER — PHENYLEPHRINE HCL (PRESSORS) 10 MG/ML IV SOLN
INTRAVENOUS | Status: DC | PRN
  Administered 2022-05-21: 80 ug via INTRAVENOUS
  Administered 2022-05-21: 160 ug via INTRAVENOUS

## 2022-05-21 MED ORDER — LACTATED RINGERS IV SOLN: INTRAVENOUS | Status: DC

## 2022-05-21 MED ORDER — FLEET ENEMA 7-19 GM/118ML RE ENEM: 1.0000 | ENEMA | Freq: Once | RECTAL | Status: DC | PRN

## 2022-05-21 MED ORDER — OXYCODONE HCL 5 MG PO TABS
5.0000 mg | ORAL_TABLET | ORAL | Status: DC | PRN
  Administered 2022-05-21 – 2022-05-22 (×4): 10 mg via ORAL
  Filled 2022-05-21 (×4): qty 2

## 2022-05-21 MED ORDER — SODIUM CHLORIDE 0.9 % IV SOLN: INTRAVENOUS | Status: DC

## 2022-05-21 MED ORDER — METOCLOPRAMIDE HCL 5 MG/ML IJ SOLN: 5.0000 mg | Freq: Three times a day (TID) | INTRAMUSCULAR | Status: DC | PRN

## 2022-05-21 MED ORDER — FENTANYL CITRATE PF 50 MCG/ML IJ SOSY
50.0000 ug | PREFILLED_SYRINGE | INTRAMUSCULAR | Status: DC
  Administered 2022-05-21: 50 ug via INTRAVENOUS
  Filled 2022-05-21: qty 2

## 2022-05-21 MED ORDER — DIPHENHYDRAMINE HCL 12.5 MG/5ML PO ELIX: 12.5000 mg | ORAL_SOLUTION | ORAL | Status: DC | PRN

## 2022-05-21 MED ORDER — ROPIVACAINE HCL 5 MG/ML IJ SOLN
INTRAMUSCULAR | Status: DC | PRN
  Administered 2022-05-21: 30 mL via PERINEURAL

## 2022-05-21 MED ORDER — HYDROMORPHONE HCL 1 MG/ML IJ SOLN: 0.2500 mg | INTRAMUSCULAR | Status: DC | PRN

## 2022-05-21 MED ORDER — METHOCARBAMOL 500 MG IVPB - SIMPLE MED
500.0000 mg | Freq: Four times a day (QID) | INTRAVENOUS | Status: DC | PRN
Start: 2022-05-21 — End: 2022-05-22

## 2022-05-21 SURGICAL SUPPLY — 57 items
ATTUNE MED DOME PAT 38 KNEE (Knees) ×1 IMPLANT
ATTUNE PS FEM LT SZ 6 CEM KNEE (Femur) ×1 IMPLANT
ATTUNE PSRP INSR SZ6 8 KNEE (Insert) ×1 IMPLANT
BAG COUNTER SPONGE SURGICOUNT (BAG) ×1 IMPLANT
BAG ZIPLOCK 12X15 (MISCELLANEOUS) ×2 IMPLANT
BASE TIBIA ATTUNE KNEE SYS SZ6 (Knees) IMPLANT
BLADE SAG 18X100X1.27 (BLADE) ×2 IMPLANT
BLADE SAW SGTL 11.0X1.19X90.0M (BLADE) ×2 IMPLANT
BNDG ELASTIC 6X5.8 VLCR STR LF (GAUZE/BANDAGES/DRESSINGS) ×2 IMPLANT
BOWL SMART MIX CTS (DISPOSABLE) ×2 IMPLANT
CEMENT HV SMART SET (Cement) ×4 IMPLANT
COVER SURGICAL LIGHT HANDLE (MISCELLANEOUS) ×2 IMPLANT
CUFF TOURN SGL QUICK 34 (TOURNIQUET CUFF) ×1
CUFF TRNQT CYL 34X4.125X (TOURNIQUET CUFF) ×1 IMPLANT
DRAPE INCISE IOBAN 66X45 STRL (DRAPES) ×2 IMPLANT
DRAPE U-SHAPE 47X51 STRL (DRAPES) ×2 IMPLANT
DRESSING MEPILEX FLEX 4X4 (GAUZE/BANDAGES/DRESSINGS) IMPLANT
DRSG AQUACEL AG ADV 3.5X10 (GAUZE/BANDAGES/DRESSINGS) ×2 IMPLANT
DRSG MEPILEX FLEX 4X4 (GAUZE/BANDAGES/DRESSINGS) ×2
DURAPREP 26ML APPLICATOR (WOUND CARE) ×2 IMPLANT
ELECT REM PT RETURN 15FT ADLT (MISCELLANEOUS) ×2 IMPLANT
GLOVE BIO SURGEON STRL SZ 6.5 (GLOVE) ×4 IMPLANT
GLOVE BIO SURGEON STRL SZ7.5 (GLOVE) IMPLANT
GLOVE BIO SURGEON STRL SZ8 (GLOVE) ×2 IMPLANT
GLOVE BIOGEL PI IND STRL 6.5 (GLOVE) IMPLANT
GLOVE BIOGEL PI IND STRL 7.0 (GLOVE) IMPLANT
GLOVE BIOGEL PI IND STRL 8 (GLOVE) ×1 IMPLANT
GLOVE BIOGEL PI INDICATOR 6.5 (GLOVE)
GLOVE BIOGEL PI INDICATOR 7.0 (GLOVE) ×4
GLOVE BIOGEL PI INDICATOR 8 (GLOVE) ×1
GOWN STRL REUS W/ TWL LRG LVL3 (GOWN DISPOSABLE) ×1 IMPLANT
GOWN STRL REUS W/ TWL XL LVL3 (GOWN DISPOSABLE) IMPLANT
GOWN STRL REUS W/TWL LRG LVL3 (GOWN DISPOSABLE) ×1
GOWN STRL REUS W/TWL XL LVL3 (GOWN DISPOSABLE)
HANDPIECE INTERPULSE COAX TIP (DISPOSABLE) ×1
HOLDER FOLEY CATH W/STRAP (MISCELLANEOUS) ×1 IMPLANT
IMMOBILIZER KNEE 20 (SOFTGOODS) ×2
IMMOBILIZER KNEE 20 THIGH 36 (SOFTGOODS) ×1 IMPLANT
KIT TURNOVER KIT A (KITS) IMPLANT
MANIFOLD NEPTUNE II (INSTRUMENTS) ×2 IMPLANT
NS IRRIG 1000ML POUR BTL (IV SOLUTION) ×2 IMPLANT
PACK TOTAL KNEE CUSTOM (KITS) ×2 IMPLANT
PADDING CAST COTTON 6X4 STRL (CAST SUPPLIES) ×3 IMPLANT
PROTECTOR NERVE ULNAR (MISCELLANEOUS) ×2 IMPLANT
SET HNDPC FAN SPRY TIP SCT (DISPOSABLE) ×1 IMPLANT
SPIKE FLUID TRANSFER (MISCELLANEOUS) ×1 IMPLANT
STRIP CLOSURE SKIN 1/2X4 (GAUZE/BANDAGES/DRESSINGS) ×4 IMPLANT
SUT MNCRL AB 4-0 PS2 18 (SUTURE) ×2 IMPLANT
SUT STRATAFIX 0 PDS 27 VIOLET (SUTURE) ×2
SUT VIC AB 2-0 CT1 27 (SUTURE) ×3
SUT VIC AB 2-0 CT1 TAPERPNT 27 (SUTURE) ×3 IMPLANT
SUTURE STRATFX 0 PDS 27 VIOLET (SUTURE) ×1 IMPLANT
TIBIA ATTUNE KNEE SYS BASE SZ6 (Knees) ×2 IMPLANT
TRAY FOLEY MTR SLVR 16FR STAT (SET/KITS/TRAYS/PACK) ×2 IMPLANT
TUBE SUCTION HIGH CAP CLEAR NV (SUCTIONS) ×2 IMPLANT
WATER STERILE IRR 1000ML POUR (IV SOLUTION) ×4 IMPLANT
WRAP KNEE MAXI GEL POST OP (GAUZE/BANDAGES/DRESSINGS) ×2 IMPLANT

## 2022-05-21 NOTE — Transfer of Care (Signed)
Immediate Anesthesia Transfer of Care Note  Patient: Mark Hall  Procedure(s) Performed: TOTAL KNEE ARTHROPLASTY (Left: Knee)  Patient Location: PACU  Anesthesia Type:Spinal  Level of Consciousness: sedated, patient cooperative and responds to stimulation  Airway & Oxygen Therapy: Patient Spontanous Breathing and Patient connected to face mask oxygen  Post-op Assessment: Report given to RN and Post -op Vital signs reviewed and stable  Post vital signs: Reviewed and stable  Last Vitals:  Vitals Value Taken Time  BP 103/60 05/21/22 1534  Temp    Pulse 76 05/21/22 1537  Resp 15 05/21/22 1537  SpO2 100 % 05/21/22 1537  Vitals shown include unvalidated device data.  Last Pain:  Vitals:   05/21/22 1158  TempSrc: Oral  PainSc: 0-No pain      Patients Stated Pain Goal: 4 (02/63/78 5885)  Complications: No notable events documented.

## 2022-05-21 NOTE — Op Note (Signed)
OPERATIVE REPORT-TOTAL KNEE ARTHROPLASTY   Pre-operative diagnosis- Osteoarthritis  Left knee(s)  Post-operative diagnosis- Osteoarthritis Left knee(s)  Procedure-  Left  Total Knee Arthroplasty  Surgeon- Dione Plover. Tripton Ned, MD  Assistant- Theresa Duty, PA-C   Anesthesia-   Adductor canal block and spinal  EBL-25 mL   Drains None  Tourniquet time-32 minutes @ 299 mm HG  Complications- None  Condition-PACU - hemodynamically stable.   Brief Clinical Note   Mark Hall is a 81 y.o. year old male with end stage OA of his left knee with progressively worsening pain and dysfunction. He has constant pain, with activity and at rest and significant functional deficits with difficulties even with ADLs. He has had extensive non-op management including analgesics, injections of cortisone, and home exercise program, but remains in significant pain with significant dysfunction. Radiographs show bone on bone arthritis medial and patellofemoral. He presents now for left Total Knee Arthroplasty.     Procedure in detail---   The patient is brought into the operating room and positioned supine on the operating table. After successful administration of  Adductor canal block and spinal,   a tourniquet is placed high on the  Left thigh(s) and the lower extremity is prepped and draped in the usual sterile fashion. Time out is performed by the operating team and then the  Left lower extremity is wrapped in Esmarch, knee flexed and the tourniquet inflated to 300 mmHg.       A midline incision is made with a ten blade through the subcutaneous tissue to the level of the extensor mechanism. A fresh blade is used to make a medial parapatellar arthrotomy. Soft tissue over the proximal medial tibia is subperiosteally elevated to the joint line with a knife and into the semimembranosus bursa with a Cobb elevator. Soft tissue over the proximal lateral tibia is elevated with attention being paid to avoiding the  patellar tendon on the tibial tubercle. The patella is everted, knee flexed 90 degrees and the ACL and PCL are removed. Findings are bone on bone medial and patellofemoral with large global osteophytes        The drill is used to create a starting hole in the distal femur and the canal is thoroughly irrigated with sterile saline to remove the fatty contents. The 5 degree Left  valgus alignment guide is placed into the femoral canal and the distal femoral cutting block is pinned to remove 9 mm off the distal femur. Resection is made with an oscillating saw.      The tibia is subluxed forward and the menisci are removed. The extramedullary alignment guide is placed referencing proximally at the medial aspect of the tibial tubercle and distally along the second metatarsal axis and tibial crest. The block is pinned to remove 107m off the more deficient medial  side. Resection is made with an oscillating saw. Size 6is the most appropriate size for the tibia and the proximal tibia is prepared with the modular drill and keel punch for that size.      The femoral sizing guide is placed and size 6 is most appropriate. Rotation is marked off the epicondylar axis and confirmed by creating a rectangular flexion gap at 90 degrees. The size 6 cutting block is pinned in this rotation and the anterior, posterior and chamfer cuts are made with the oscillating saw. The intercondylar block is then placed and that cut is made.      Trial size 6 tibial component, trial size 6 posterior stabilized  femur and a 8  mm posterior stabilized rotating platform insert trial is placed. Full extension is achieved with excellent varus/valgus and anterior/posterior balance throughout full range of motion. The patella is everted and thickness measured to be 24  mm. Free hand resection is taken to 14 mm, a 38 template is placed, lug holes are drilled, trial patella is placed, and it tracks normally. Osteophytes are removed off the posterior femur  with the trial in place. All trials are removed and the cut bone surfaces prepared with pulsatile lavage. Cement is mixed and once ready for implantation, the size 6 tibial implant, size  6 posterior stabilized femoral component, and the size 38 patella are cemented in place and the patella is held with the clamp. The trial insert is placed and the knee held in full extension. The Exparel (20 ml mixed with 60 ml saline) is injected into the extensor mechanism, posterior capsule, medial and lateral gutters and subcutaneous tissues.  All extruded cement is removed and once the cement is hard the permanent 8 mm posterior stabilized rotating platform insert is placed into the tibial tray.      The wound is copiously irrigated with saline solution and the extensor mechanism closed with # 0 Stratofix suture. The tourniquet is released for a total tourniquet time of 32  minutes. Flexion against gravity is 140 degrees and the patella tracks normally. Subcutaneous tissue is closed with 2.0 vicryl and subcuticular with running 4.0 Monocryl. The incision is cleaned and dried and steri-strips and a bulky sterile dressing are applied. The limb is placed into a knee immobilizer and the patient is awakened and transported to recovery in stable condition.      Please note that a surgical assistant was a medical necessity for this procedure in order to perform it in a safe and expeditious manner. Surgical assistant was necessary to retract the ligaments and vital neurovascular structures to prevent injury to them and also necessary for proper positioning of the limb to allow for anatomic placement of the prosthesis.   Dione Plover Niylah Hassan, MD    05/21/2022, 3:10 PM

## 2022-05-21 NOTE — Care Plan (Signed)
Ortho Bundle Case Management Note  Patient Details  Name: Mark Hall MRN: [id number] Date of Birth: 1941/06/01                  L TKA on 05-21-22 DCP: Home with wife DME: No needs, borrowing a RW PT: [facility]. Referral sent for [facility] 05-24-22.   DME Arranged:  N/A DME Agency:     HH Arranged:    HH Agency:     Additional Comments: Please contact me with any questions of if this plan should need to change.  Marianne Sofia, RN,CCM [facility]  [phone] 05/21/2022, 2:35 PM

## 2022-05-21 NOTE — Interval H&P Note (Signed)
History and Physical Interval Note:  05/21/2022 1:00 PM  Mark Hall  has presented today for surgery, with the diagnosis of left knee osteoarthritis.  The various methods of treatment have been discussed with the patient and family. After consideration of risks, benefits and other options for treatment, the patient has consented to  Procedure(s): TOTAL KNEE ARTHROPLASTY (Left) as a surgical intervention.  The patient's history has been reviewed, patient examined, no change in status, stable for surgery.  I have reviewed the patient's chart and labs.  Questions were answered to the patient's satisfaction.     Pilar Plate Chez Bulnes

## 2022-05-21 NOTE — Discharge Instructions (Signed)
 Frank Aluisio, MD Total Joint Specialist [facility] [address] [place], [place] [address] [phone]  TOTAL KNEE REPLACEMENT POSTOPERATIVE DIRECTIONS    Knee Rehabilitation, Guidelines Following Surgery  Results after knee surgery are often greatly improved when you follow the exercise, range of motion and muscle strengthening exercises prescribed by your doctor. Safety measures are also important to protect the knee from further injury. If any of these exercises cause you to have increased pain or swelling in your knee joint, decrease the amount until you are comfortable again and slowly increase them. If you have problems or questions, call your caregiver or physical therapist for advice.   BLOOD CLOT PREVENTION Take a 325 mg Aspirin two times a day for three weeks following surgery. Then take an 81 mg Aspirin once a day for three weeks. Then discontinue Aspirin. You may resume your vitamins/supplements upon discharge from the hospital. Do not take any NSAIDs (Advil, Aleve, Ibuprofen, Meloxicam, etc.) until you have discontinued the 325 mg Aspirin.  HOME CARE INSTRUCTIONS  Remove items at home which could result in a fall. This includes throw rugs or furniture in walking pathways.  ICE to the affected knee as much as tolerated. Icing helps control swelling. If the swelling is well controlled you will be more comfortable and rehab easier. Continue to use ice on the knee for pain and swelling from surgery. You may notice swelling that will progress down to the foot and ankle. This is normal after surgery. Elevate the leg when you are not up walking on it.    Continue to use the breathing machine which will help keep your temperature down. It is common for your temperature to cycle up and down following surgery, especially at night when you are not up moving around and exerting yourself. The breathing machine keeps your lungs expanded and your temperature  down. Do not place pillow under the operative knee, focus on keeping the knee straight while resting  DIET You may resume your previous home diet once you are discharged from the hospital.  DRESSING / WOUND CARE / SHOWERING Keep your bulky bandage on for 2 days. On the third post-operative day you may remove the Ace bandage and gauze. There is a waterproof adhesive bandage on your skin which will stay in place until your first follow-up appointment. Once you remove this you will not need to place another bandage You may begin showering 3 days following surgery, but do not submerge the incision under water.  ACTIVITY For the first 5 days, the key is rest and control of pain and swelling Do your home exercises twice a day starting on post-operative day 3. On the days you go to physical therapy, just do the home exercises once that day. You should rest, ice and elevate the leg for 50 minutes out of every hour. Get up and walk/stretch for 10 minutes per hour. After 5 days you can increase your activity slowly as tolerated. Walk with your walker as instructed. Use the walker until you are comfortable transitioning to a cane. Walk with the cane in the opposite hand of the operative leg. You may discontinue the cane once you are comfortable and walking steadily. Avoid periods of inactivity such as sitting longer than an hour when not asleep. This helps prevent blood clots.  You may discontinue the knee immobilizer once you are able to perform a straight leg raise while lying down. You may resume a sexual relationship in one month   or when given the OK by your doctor.  You may return to work once you are cleared by your doctor.  Do not drive a car for 6 weeks or until released by your surgeon.  Do not drive while taking narcotics.  TED HOSE STOCKINGS Wear the elastic stockings on both legs for three weeks following surgery during the day. You may remove them at night for sleeping.  WEIGHT  BEARING Weight bearing as tolerated with assist device (walker, cane, etc) as directed, use it as long as suggested by your surgeon or therapist, typically at least 4-6 weeks.  POSTOPERATIVE CONSTIPATION PROTOCOL Constipation - defined medically as fewer than three stools per week and severe constipation as less than one stool per week.  One of the most common issues patients have following surgery is constipation.  Even if you have a regular bowel pattern at home, your normal regimen is likely to be disrupted due to multiple reasons following surgery.  Combination of anesthesia, postoperative narcotics, change in appetite and fluid intake all can affect your bowels.  In order to avoid complications following surgery, here are some recommendations in order to help you during your recovery period.  Colace (docusate) - Pick up an over-the-counter form of Colace or another stool softener and take twice a day as long as you are requiring postoperative pain medications.  Take with a full glass of water daily.  If you experience loose stools or diarrhea, hold the colace until you stool forms back up. If your symptoms do not get better within 1 week or if they get worse, check with your doctor. Dulcolax (bisacodyl) - Pick up over-the-counter and take as directed by the product packaging as needed to assist with the movement of your bowels.  Take with a full glass of water.  Use this product as needed if not relieved by Colace only.  MiraLax (polyethylene glycol) - Pick up over-the-counter to have on hand. MiraLax is a solution that will increase the amount of water in your bowels to assist with bowel movements.  Take as directed and can mix with a glass of water, juice, soda, coffee, or tea. Take if you go more than two days without a movement. Do not use MiraLax more than once per day. Call your doctor if you are still constipated or irregular after using this medication for 7 days in a row.  If you continue  to have problems with postoperative constipation, please contact the office for further assistance and recommendations.  If you experience "the worst abdominal pain ever" or develop nausea or vomiting, please contact the office immediatly for further recommendations for treatment.  ITCHING If you experience itching with your medications, try taking only a single pain pill, or even half a pain pill at a time.  You can also use Benadryl over the counter for itching or also to help with sleep.   MEDICATIONS See your medication summary on the "After Visit Summary" that the nursing staff will review with you prior to discharge.  You may have some home medications which will be placed on hold until you complete the course of blood thinner medication.  It is important for you to complete the blood thinner medication as prescribed by your surgeon.  Continue your approved medications as instructed at time of discharge.  PRECAUTIONS If you experience chest pain or shortness of breath - call 911 immediately for transfer to the hospital emergency department.  If you develop a fever greater that 101 F,   purulent drainage from wound, increased redness or drainage from wound, foul odor from the wound/dressing, or calf pain - CONTACT YOUR SURGEON.                                                   FOLLOW-UP APPOINTMENTS Make sure you keep all of your appointments after your operation with your surgeon and caregivers. You should call the office at the above phone number and make an appointment for approximately two weeks after the date of your surgery or on the date instructed by your surgeon outlined in the "After Visit Summary".  RANGE OF MOTION AND STRENGTHENING EXERCISES  Rehabilitation of the knee is important following a knee injury or an operation. After just a few days of immobilization, the muscles of the thigh which control the knee become weakened and shrink (atrophy). Knee exercises are designed to build up  the tone and strength of the thigh muscles and to improve knee motion. Often times heat used for twenty to thirty minutes before working out will loosen up your tissues and help with improving the range of motion but do not use heat for the first two weeks following surgery. These exercises can be done on a training (exercise) mat, on the floor, on a table or on a bed. Use what ever works the best and is most comfortable for you Knee exercises include:  Leg Lifts - While your knee is still immobilized in a splint or cast, you can do straight leg raises. Lift the leg to 60 degrees, hold for 3 sec, and slowly lower the leg. Repeat 10-20 times 2-3 times daily. Perform this exercise against resistance later as your knee gets better.  Quad and Hamstring Sets - Tighten up the muscle on the front of the thigh (Quad) and hold for 5-10 sec. Repeat this 10-20 times hourly. Hamstring sets are done by pushing the foot backward against an object and holding for 5-10 sec. Repeat as with quad sets.  Leg Slides: Lying on your back, slowly slide your foot toward your buttocks, bending your knee up off the floor (only go as far as is comfortable). Then slowly slide your foot back down until your leg is flat on the floor again. Angel Wings: Lying on your back spread your legs to the side as far apart as you can without causing discomfort.  A rehabilitation program following serious knee injuries can speed recovery and prevent re-injury in the future due to weakened muscles. Contact your doctor or a physical therapist for more information on knee rehabilitation.   POST-OPERATIVE OPIOID TAPER INSTRUCTIONS: It is important to wean off of your opioid medication as soon as possible. If you do not need pain medication after your surgery it is ok to stop day one. Opioids include: Codeine, Hydrocodone(Norco, Vicodin), Oxycodone(Percocet, oxycontin) and hydromorphone amongst others.  Long term and even short term use of opiods can  cause: Increased pain response Dependence Constipation Depression Respiratory depression And more.  Withdrawal symptoms can include Flu like symptoms Nausea, vomiting And more Techniques to manage these symptoms Hydrate well Eat regular healthy meals Stay active Use relaxation techniques(deep breathing, meditating, yoga) Do Not substitute Alcohol to help with tapering If you have been on opioids for less than two weeks and do not have pain than it is ok to stop all together.  Plan   to wean off of opioids This plan should start within one week post op of your joint replacement. Maintain the same interval or time between taking each dose and first decrease the dose.  Cut the total daily intake of opioids by one tablet each day Next start to increase the time between doses. The last dose that should be eliminated is the evening dose.   IF YOU ARE TRANSFERRED TO A SKILLED REHAB FACILITY If the patient is transferred to a skilled rehab facility following release from the hospital, a list of the current medications will be sent to the facility for the patient to continue.  When discharged from the skilled rehab facility, please have the facility set up the patient's Home Health Physical Therapy prior to being released. Also, the skilled facility will be responsible for providing the patient with their medications at time of release from the facility to include their pain medication, the muscle relaxants, and their blood thinner medication. If the patient is still at the rehab facility at time of the two week follow up appointment, the skilled rehab facility will also need to assist the patient in arranging follow up appointment in our office and any transportation needs.  MAKE SURE YOU:  Understand these instructions.  Get help right away if you are not doing well or get worse.   DENTAL ANTIBIOTICS:  In most cases prophylactic antibiotics for Dental procdeures after total joint surgery are  not necessary.  Exceptions are as follows:  1. History of prior total joint infection  2. Severely immunocompromised (Organ Transplant, cancer chemotherapy, Rheumatoid biologic meds such as Humera)  3. Poorly controlled diabetes (A1C &gt; 8.0, blood glucose over 200)  If you have one of these conditions, contact your surgeon for an antibiotic prescription, prior to your dental procedure.    Pick up stool softner and laxative for home use following surgery while on pain medications. Do not submerge incision under water. Please use good hand washing techniques while changing dressing each day. May shower starting three days after surgery. Please use a clean towel to pat the incision dry following showers. Continue to use ice for pain and swelling after surgery. Do not use any lotions or creams on the incision until instructed by your surgeon.  

## 2022-05-21 NOTE — Anesthesia Preprocedure Evaluation (Addendum)
Anesthesia Evaluation  Patient identified by MRN, date of birth, ID band Patient awake    Reviewed: Allergy & Precautions, NPO status , Patient's Chart, lab work & pertinent test results  Airway Mallampati: II  TM Distance: >3 FB Neck ROM: Full    Dental  (+) Teeth Intact, Dental Advisory Given   Pulmonary    breath sounds clear to auscultation       Cardiovascular hypertension, Pt. on medications  Rhythm:Regular Rate:Normal     Neuro/Psych negative neurological ROS  negative psych ROS   GI/Hepatic negative GI ROS, Neg liver ROS,   Endo/Other  negative endocrine ROS  Renal/GU negative Renal ROS     Musculoskeletal  (+) Arthritis ,   Abdominal Normal abdominal exam  (+)   Peds  Hematology negative hematology ROS (+)   Anesthesia Other Findings   Reproductive/Obstetrics                            Anesthesia Physical Anesthesia Plan  ASA: 2  Anesthesia Plan: Spinal   Post-op Pain Management: Regional block*   Induction:   PONV Risk Score and Plan: 2 and Ondansetron and Propofol infusion  Airway Management Planned: Natural Airway and Simple Face Mask  Additional Equipment: None  Intra-op Plan:   Post-operative Plan:   Informed Consent: I have reviewed the patients History and Physical, chart, labs and discussed the procedure including the risks, benefits and alternatives for the proposed anesthesia with the patient or authorized representative who has indicated his/her understanding and acceptance.       Plan Discussed with: CRNA  Anesthesia Plan Comments: (Lab Results      Component                Value               Date                      WBC                      9.1                 05/09/2022                HGB                      14.2                05/09/2022                HCT                      44.6                05/09/2022                MCV                       99.6                05/09/2022                PLT                      272  05/09/2022           )       Anesthesia Quick Evaluation

## 2022-05-21 NOTE — Anesthesia Postprocedure Evaluation (Signed)
Anesthesia Post Note  Patient: Mark Hall  Procedure(s) Performed: TOTAL KNEE ARTHROPLASTY (Left: Knee)     Patient location during evaluation: PACU Anesthesia Type: Spinal Level of consciousness: oriented and awake and alert Pain management: pain level controlled Vital Signs Assessment: post-procedure vital signs reviewed and stable Respiratory status: spontaneous breathing, respiratory function stable and patient connected to nasal cannula oxygen Cardiovascular status: blood pressure returned to baseline and stable Postop Assessment: no headache, no backache and no apparent nausea or vomiting Anesthetic complications: no   No notable events documented.  Last Vitals:  Vitals:   05/21/22 1730 05/21/22 1824  BP: (!) 154/93 (!) 138/91  Pulse: 84 100  Resp: 18 16  Temp: 36.5 C (!) 36.4 C  SpO2: 96% 94%    Last Pain:  Vitals:   05/21/22 1824  TempSrc: Oral  PainSc:                  [name]

## 2022-05-21 NOTE — Progress Notes (Signed)
AssistedDr. Smith Robert with left, adductor canal block. Side rails up, monitors on throughout procedure. See vital signs in flow sheet. Tolerated Procedure well.

## 2022-05-21 NOTE — Anesthesia Procedure Notes (Signed)
Spinal  Start time: 05/21/2022 1:58 PM End time: 05/21/2022 2:00 PM Reason for block: surgical anesthesia Staffing Performed: anesthesiologist  Anesthesiologist: [name], MD Preanesthetic Checklist Completed: patient identified, IV checked, site marked, risks and benefits discussed, surgical consent, monitors and equipment checked, pre-op evaluation and timeout performed Spinal Block Patient position: sitting Prep: DuraPrep and site prepped and draped Location: L3-4 Injection technique: single-shot Needle Needle type: Pencan  Needle gauge: 24 G Needle length: 10 cm Needle insertion depth: 10 cm Additional Notes Patient tolerated well. No immediate complications.  Functioning IV was confirmed and monitors were applied. Sterile prep and drape, including hand hygiene and sterile gloves were used. The patient was positioned and the back was prepped. The skin was anesthetized with lidocaine. Free flow of clear CSF was obtained prior to injecting local anesthetic into the CSF. The spinal needle aspirated freely following injection. The needle was carefully withdrawn. The patient tolerated the procedure well.

## 2022-05-21 NOTE — Progress Notes (Signed)
Orthopedic Tech Progress Note Patient Details:  Mark Hall 05-May-1941 [id number]  CPM Left Knee CPM Left Knee: On Left Knee Flexion (Degrees): 40 Left Knee Extension (Degrees): 10  Post Interventions Patient Tolerated: Well Instructions Provided: Care of device  [place] Pink 05/21/2022, 3:23 PM

## 2022-05-21 NOTE — Anesthesia Procedure Notes (Signed)
Anesthesia Regional Block: Adductor canal block   Pre-Anesthetic Checklist: , timeout performed,  Correct Patient, Correct Site, Correct Laterality,  Correct Procedure, Correct Position, site marked,  Risks and benefits discussed,  Surgical consent,  Pre-op evaluation,  At surgeon's request and post-op pain management  Laterality: Left  Prep: chloraprep       Needles:  Injection technique: Single-shot  Needle Type: Echogenic Stimulator Needle     Needle Length: 9cm  Needle Gauge: 21     Additional Needles:   Procedures:,,,, ultrasound used (permanent image in chart),,    Narrative:  Start time: 05/21/2022 12:55 PM End time: 05/21/2022 1:00 PM Injection made incrementally with aspirations every 5 mL.  Performed by: Personally  Anesthesiologist: [name], MD  Additional Notes: Patient tolerated the procedure well. Local anesthetic introduced in an incremental fashion under minimal resistance after negative aspirations. No paresthesias were elicited. After completion of the procedure, no acute issues were identified and patient continued to be monitored by RN.

## 2022-05-22 ENCOUNTER — Other Ambulatory Visit: Payer: Self-pay

## 2022-05-22 ENCOUNTER — Other Ambulatory Visit (HOSPITAL_COMMUNITY): Payer: Self-pay

## 2022-05-22 DIAGNOSIS — Z859 Personal history of malignant neoplasm, unspecified: Secondary | ICD-10-CM | POA: Diagnosis not present

## 2022-05-22 DIAGNOSIS — I10 Essential (primary) hypertension: Secondary | ICD-10-CM | POA: Diagnosis not present

## 2022-05-22 DIAGNOSIS — M1712 Unilateral primary osteoarthritis, left knee: Secondary | ICD-10-CM | POA: Diagnosis not present

## 2022-05-22 LAB — CBC
HCT: 37.8 % — ABNORMAL LOW (ref 39.0–52.0)
Hemoglobin: 12.6 g/dL — ABNORMAL LOW (ref 13.0–17.0)
MCH: 33 pg (ref 26.0–34.0)
MCHC: 33.3 g/dL (ref 30.0–36.0)
MCV: 99 fL (ref 80.0–100.0)
Platelets: 247 10*3/uL (ref 150–400)
RBC: 3.82 MIL/uL — ABNORMAL LOW (ref 4.22–5.81)
RDW: 12.5 % (ref 11.5–15.5)
WBC: 18.6 10*3/uL — ABNORMAL HIGH (ref 4.0–10.5)
nRBC: 0 % (ref 0.0–0.2)

## 2022-05-22 LAB — BASIC METABOLIC PANEL
Anion gap: 5 (ref 5–15)
BUN: 17 mg/dL (ref 8–23)
CO2: 28 mmol/L (ref 22–32)
Calcium: 8.7 mg/dL — ABNORMAL LOW (ref 8.9–10.3)
Chloride: 102 mmol/L (ref 98–111)
Creatinine, Ser: 0.9 mg/dL (ref 0.61–1.24)
GFR, Estimated: >60 mL/min (ref >60)
Glucose, Bld: 175 mg/dL — ABNORMAL HIGH (ref 70–99)
Potassium: 4.4 mmol/L (ref 3.5–5.1)
Sodium: 135 mmol/L (ref 135–145)

## 2022-05-22 MED ORDER — TRAMADOL HCL 50 MG PO TABS: 50.0000 mg | ORAL_TABLET | Freq: Four times a day (QID) | ORAL | 0 refills | Status: DC | PRN

## 2022-05-22 MED ORDER — METHOCARBAMOL 500 MG PO TABS: 500.0000 mg | ORAL_TABLET | Freq: Four times a day (QID) | ORAL | 0 refills | Status: DC | PRN

## 2022-05-22 MED ORDER — ASPIRIN 325 MG PO TBEC: 325.0000 mg | DELAYED_RELEASE_TABLET | Freq: Two times a day (BID) | ORAL | 0 refills | Status: AC

## 2022-05-22 MED ORDER — OXYCODONE HCL 5 MG PO TABS
5.0000 mg | ORAL_TABLET | Freq: Four times a day (QID) | ORAL | 0 refills | Status: DC | PRN
  Filled 2022-05-22 – 2022-05-29 (×2): qty 42, 7d supply, fill #0

## 2022-05-22 MED ORDER — OXYCODONE HCL 5 MG PO TABS: 5.0000 mg | ORAL_TABLET | Freq: Four times a day (QID) | ORAL | 0 refills | Status: DC | PRN

## 2022-05-22 NOTE — TOC Transition Note (Signed)
Transition of Care [facility]) - CM/SW Discharge Note  Patient Details  Name: Mark Hall MRN: [id number] Date of Birth: 03-28-1941  Transition of [facility]) CM/SW Contact:  Sherie Don, LCSW Phone Number: 05/22/2022, 9:50 AM  Clinical Narrative: Patient is expected to discharge home after working with PT. CSW met with patient to confirm discharge plan and needs. Patient will go home with OPPT at [facility] in [facility]. Patient confirmed he is borrowing a rolling walker and will not need one ordered. TOC signing off.  Final next level of care: OP Rehab Barriers to Discharge: No Barriers Identified  Patient Goals and CMS Choice Patient states their goals for this hospitalization and ongoing recovery are:: Discharge home with OPPT at [facility] PT Choice offered to / list presented to : NA  Discharge Plan and Services         DME Arranged: N/A DME Agency: NA  Readmission Risk Interventions     View : No data to display.

## 2022-05-22 NOTE — Plan of Care (Signed)

## 2022-05-22 NOTE — Progress Notes (Signed)
05/22/22 1400  PT Visit Information  Last PT Received On 05/22/22  Pt progressing well. Meeting goals. Wife present for session. Reviewed HEP, activity progression and not over doing exercises.  Incr gait distance this pm. Verbally reviewed up/down threshold at home. Pt is ready to d/c from PT standpoint with family assist as needed   Assistance Needed +1  History of Present Illness 81 yo male s/p L TKA on 05/21/22.  Subjective Data  Patient Stated Goal home  Precautions  Precautions Fall;Knee  Precaution Comments IND SLRs  Required Braces or Orthoses Knee Immobilizer - Left  Restrictions  Weight Bearing Restrictions No  Other Position/Activity Restrictions WBAT  Pain Assessment  Pain Assessment Faces  Faces Pain Scale 2  Pain Location L knee  Pain Descriptors / Indicators Aching;Sore;Grimacing  Pain Intervention(s) Limited activity within patient's tolerance;Monitored during session;Repositioned  Cognition  Arousal/Alertness Awake/alert  Behavior During Therapy WFL for tasks assessed/performed  Overall Cognitive Status Within Functional Limits for tasks assessed  Bed Mobility  General bed mobility comments in recliner  Transfers  Overall transfer level Needs assistance  Equipment used Rolling walker (2 wheels)  Transfers Sit to/from Stand  Sit to Stand Supervision  General transfer comment cues for hand placement and RLE position  Ambulation/Gait  Ambulation/Gait assistance Min guard;Supervision  Gait Distance (Feet) 130 Feet  Assistive device Rolling walker (2 wheels)  Gait Pattern/deviations Step-to pattern  General Gait Details cues for sequence and RW position, beginning step through  Total Joint Exercises  Ankle Circles/Pumps AROM;Both;10 reps  Quad Sets AROM;Both;10 reps  Heel Slides AAROM;Left;10 reps  Straight Leg Raises AROM;Left;10 reps  Goniometric ROM grossly 5 to 65 degrees L knee flexion  PT - End of Session  Equipment Utilized During Treatment Gait  belt  Activity Tolerance Patient tolerated treatment well  Patient left in chair;with call bell/phone within reach;with family/visitor present   PT - Assessment/Plan  PT Plan Current plan remains appropriate  PT Visit Diagnosis Other abnormalities of gait and mobility (R26.89);Difficulty in walking, not elsewhere classified (R26.2)  PT Frequency (ACUTE ONLY) 7X/week  Follow Up Recommendations Follow physician's recommendations for discharge plan and follow up therapies  Assistance recommended at discharge Intermittent Supervision/Assistance  Patient can return home with the following Help with stairs or ramp for entrance;A little help with bathing/dressing/bathroom;Assistance with cooking/housework;Assist for transportation  PT equipment None recommended by PT  AM-PAC PT "6 Clicks" Mobility Outcome Measure (Version 2)  Help needed turning from your back to your side while in a flat bed without using bedrails? 4  Help needed moving from lying on your back to sitting on the side of a flat bed without using bedrails? 4  Help needed moving to and from a bed to a chair (including a wheelchair)? 3  Help needed standing up from a chair using your arms (e.g., wheelchair or bedside chair)? 3  Help needed to walk in hospital room? 3  Help needed climbing 3-5 steps with a railing?  3  6 Click Score 20  Consider Recommendation of Discharge To: Home with no services  Progressive Mobility  What is the highest level of mobility based on the progressive mobility assessment? Level 5 (Walks with assist in room/hall) - Balance while stepping forward/back and can walk in room with assist - Complete  Activity Ambulated with assistance in hallway  PT Goal Progression  Progress towards PT goals Progressing toward goals  Acute Rehab PT Goals  PT Goal Formulation With patient  Time For Goal Achievement  05/29/22  Potential to Achieve Goals Good  PT Time Calculation  PT Start Time (ACUTE ONLY) 1345  PT Stop Time  (ACUTE ONLY) 1403  PT Time Calculation (min) (ACUTE ONLY) 18 min  PT General Charges  $$ ACUTE PT VISIT 1 Visit  PT Treatments  $Gait Training 8-22 mins

## 2022-05-22 NOTE — Progress Notes (Signed)
   Subjective: 1 Day Post-Op Procedure(s) (LRB): TOTAL KNEE ARTHROPLASTY (Left) Patient reports pain as mild.   Patient seen in rounds by Dr. Wynelle Link. Patient is well, and has had no acute complaints or problems No issues overnight. Denies chest pain, SOB, or calf pain. Foley catheter removed this AM.  We will begin therapy today.  Objective: Vital signs in last 24 hours: Temp:  [97.5 F (36.4 C)-98.6 F (37 C)] 97.7 F (36.5 C) (05/23 0532) Pulse Rate:  [68-100] 80 (05/23 0532) Resp:  [11-20] 17 (05/23 0532) BP: (103-154)/(60-93) 127/77 (05/23 0532) SpO2:  [92 %-100 %] 97 % (05/23 0532) Weight:  [73.1 kg] 73.1 kg (05/22 1158)  Intake/Output from previous day:  Intake/Output Summary (Last 24 hours) at 05/22/2022 0750 Last data filed at 05/22/2022 9702 Gross per 24 hour  Intake 1000 ml  Output 1875 ml  Net -875 ml     Intake/Output this shift: No intake/output data recorded.  Labs: Recent Labs    05/22/22 0317  HGB 12.6*   Recent Labs    05/22/22 0317  WBC 18.6*  RBC 3.82*  HCT 37.8*  PLT 247   Recent Labs    05/22/22 0317  NA 135  K 4.4  CL 102  CO2 28  BUN 17  CREATININE 0.90  GLUCOSE 175*  CALCIUM 8.7*   No results for input(s): LABPT, INR in the last 72 hours.  Exam: General - Patient is Alert and Oriented Extremity - Neurologically intact Neurovascular intact Sensation intact distally Dorsiflexion/Plantar flexion intact Dressing - dressing C/D/I Motor Function - intact, moving foot and toes well on exam.   Past Medical History:  Diagnosis Date   Arthritis    Cancer ([place])    Hypertension    Pneumonia    Pre-diabetes     Assessment/Plan: 1 Day Post-Op Procedure(s) (LRB): TOTAL KNEE ARTHROPLASTY (Left) Principal Problem:   OA (osteoarthritis) of knee Active Problems:   Primary osteoarthritis of left knee  Estimated body mass index is 23.81 kg/m as calculated from the following:   Height as of this encounter: '5\' 9"'$  (1.753 m).    Weight as of this encounter: 73.1 kg. Advance diet Up with therapy D/C IV fluids   Patient's anticipated LOS is less than 2 midnights, meeting these requirements: - Lives within 1 hour of care - Has a competent adult at home to recover with post-op recover - NO history of  - Chronic pain requiring opioids  - Diabetes  - Coronary Artery Disease  - Heart failure  - Heart attack  - Stroke  - DVT/VTE  - Cardiac arrhythmia  - Respiratory Failure/COPD  - Renal failure  - Anemia  - Advanced Liver disease  DVT Prophylaxis - Aspirin Weight bearing as tolerated. Continue therapy.  Plan is to go Home after hospital stay. Plan for discharge later today if progresses with therapy and meeting goals. Scheduled [facility] O[facility] at [facility]). Follow-up in the office in 2 weeks.  The PDMP database was reviewed today prior to any opioid medications being prescribed to this patient.  Theresa Duty, PA-C Orthopedic Surgery [phone] 05/22/2022, 7:50 AM

## 2022-05-22 NOTE — Evaluation (Signed)
Physical Therapy Evaluation Patient Details Name: Mark Hall MRN: [id number] DOB: [birth year] Today's Date: 05/22/2022  History of Present Illness  81 yo male s/p L TKA on 05/21/22.  Clinical Impression  Pt is s/p TKA resulting in the deficits listed below (see PT Problem List). Pt amb hallway distance RW and min/guard assist. HEP initiated.  Will see again later today and pt should be ready to d/c from PT standpoint this afternoon. Pt will benefit from skilled PT to increase their independence and safety with mobility to allow discharge to the venue listed below.         Recommendations for follow up therapy are one component of a multi-disciplinary discharge planning process, led by the attending physician.  Recommendations may be updated based on patient status, additional functional criteria and insurance authorization.  Follow Up Recommendations Follow physician's recommendations for discharge plan and follow up therapies    Assistance Recommended at Discharge Intermittent Supervision/Assistance  Patient can return home with the following  Help with stairs or ramp for entrance;A little help with bathing/dressing/bathroom;Assistance with cooking/housework;Assist for transportation    Equipment Recommendations None recommended by PT  Recommendations for Other Services       Functional Status Assessment Patient has had a recent decline in their functional status and demonstrates the ability to make significant improvements in function in a reasonable and predictable amount of time.     Precautions / Restrictions Precautions Precautions: Fall;Knee Precaution Comments: IND SLRs Required Braces or Orthoses: Knee Immobilizer - Left      Mobility  Bed Mobility Overal bed mobility: Needs Assistance Bed Mobility: Supine to Sit     Supine to sit: Min guard     General bed mobility comments: for safety, cues to self assist    Transfers Overall transfer level: Needs  assistance Equipment used: Rolling walker (2 wheels) Transfers: Sit to/from Stand Sit to Stand: Min guard           General transfer comment: cues for hand placement and RLE position    Ambulation/Gait Ambulation/Gait assistance: Min guard Gait Distance (Feet): 50 Feet Assistive device: Rolling walker (2 wheels) Gait Pattern/deviations: Step-to pattern       General Gait Details: cues for sequence and RW position  Stairs            Wheelchair Mobility    Modified Rankin (Stroke Patients Only)       Balance                                             Pertinent Vitals/Pain Pain Assessment Pain Assessment: Faces Faces Pain Scale: Hurts little more Pain Location: L knee Pain Descriptors / Indicators: Aching, Sore, Grimacing Pain Intervention(s): Limited activity within patient's tolerance, Monitored during session, Premedicated before session, Repositioned, Ice applied    Home Living Family/patient expects to be discharged to:: Private residence Living Arrangements: Spouse/significant other Available Help at Discharge: Family Type of Home: House Home Access: Stairs to enter   Technical brewer of Steps: threshold only   Home Layout: One level Home Equipment: Conservation officer, nature (2 wheels)      Prior Function Prior Level of Function : Independent/Modified Independent                     Hand Dominance        Extremity/Trunk Assessment   Upper Extremity Assessment Upper  Extremity Assessment: Overall WFL for tasks assessed    Lower Extremity Assessment Lower Extremity Assessment: LLE deficits/detail LLE Deficits / Details: ankle WFL, knee and hip grossly 3/5,  limitations d/t expected post op pain and weakness       Communication      Cognition Arousal/Alertness: Awake/alert Behavior During Therapy: WFL for tasks assessed/performed Overall Cognitive Status: Within Functional Limits for tasks assessed                                           General Comments      Exercises Total Joint Exercises Ankle Circles/Pumps: AROM, Both, 10 reps Quad Sets: 5 reps, Both, AROM   Assessment/Plan    PT Assessment Patient needs continued PT services  PT Problem List Decreased strength;Decreased mobility;Decreased range of motion;Decreased activity tolerance;Decreased balance;Decreased knowledge of use of DME;Pain       PT Treatment Interventions DME instruction;Therapeutic exercise;Gait training;Functional mobility training;Therapeutic activities;Patient/family education    PT Goals (Current goals can be found in the Care Plan section)  Acute Rehab PT Goals Patient Stated Goal: home PT Goal Formulation: With patient Time For Goal Achievement: 05/29/22 Potential to Achieve Goals: Good    Frequency 7X/week     Co-evaluation               AM-PAC PT "6 Clicks" Mobility  Outcome Measure Help needed turning from your back to your side while in a flat bed without using bedrails?: None Help needed moving from lying on your back to sitting on the side of a flat bed without using bedrails?: None Help needed moving to and from a bed to a chair (including a wheelchair)?: A Little Help needed standing up from a chair using your arms (e.g., wheelchair or bedside chair)?: A Little Help needed to walk in hospital room?: A Little Help needed climbing 3-5 steps with a railing? : A Little 6 Click Score: 20    End of Session Equipment Utilized During Treatment: Gait belt Activity Tolerance: Patient tolerated treatment well Patient left: in chair;with call bell/phone within reach (no alarm box in room)   PT Visit Diagnosis: Other abnormalities of gait and mobility (R26.89);Difficulty in walking, not elsewhere classified (R26.2)    Time: 1029-1050 PT Time Calculation (min) (ACUTE ONLY): 21 min   Charges:   PT Evaluation $PT Eval Low Complexity: Ward, PT  Acute  Rehab Dept ([place]) [phone] Pager [phone]  05/22/2022   [facility] 05/22/2022, 11:00 AM

## 2022-05-22 NOTE — Progress Notes (Signed)
Orthopedic Tech Progress Note Patient Details:  Mark Hall 02/06/1941 [id number]  Patient ID: Mark Hall, male   DOB: 02-23-1941, 81 y.o.   MRN: [id number] Patient is currently up in chair, will revisit later to apply [place] 05/22/2022, 2:09 PM

## 2022-05-22 NOTE — Plan of Care (Signed)
DC orders received, all DC instructions give to pt and spouse. All questions answered. PIV removed. Pt to be transported to main entrance via wheel chair.   Problem: Education: Goal: Knowledge of the prescribed therapeutic regimen will improve 05/22/2022 1519 by Warren Lacy, RN Outcome: Adequate for Discharge 05/22/2022 1423 by Warren Lacy, RN Outcome: Progressing Goal: Individualized Educational Video(s) 05/22/2022 1519 by Warren Lacy, RN Outcome: Adequate for Discharge 05/22/2022 1423 by Warren Lacy, RN Outcome: Progressing   Problem: Activity: Goal: Ability to avoid complications of mobility impairment will improve 05/22/2022 1519 by Warren Lacy, RN Outcome: Adequate for Discharge 05/22/2022 1423 by Warren Lacy, RN Outcome: Progressing Goal: Range of joint motion will improve 05/22/2022 1519 by Warren Lacy, RN Outcome: Adequate for Discharge 05/22/2022 1423 by Warren Lacy, RN Outcome: Progressing   Problem: Clinical Measurements: Goal: Postoperative complications will be avoided or minimized 05/22/2022 1519 by Warren Lacy, RN Outcome: Adequate for Discharge 05/22/2022 1423 by Warren Lacy, RN Outcome: Progressing   Problem: Pain Management: Goal: Pain level will decrease with appropriate interventions 05/22/2022 1519 by Warren Lacy, RN Outcome: Adequate for Discharge 05/22/2022 1423 by Warren Lacy, RN Outcome: Progressing   Problem: Skin Integrity: Goal: Will show signs of wound healing 05/22/2022 1519 by Warren Lacy, RN Outcome: Adequate for Discharge 05/22/2022 1423 by Warren Lacy, RN Outcome: Progressing   Problem: Education: Goal: Knowledge of General Education information will improve Description: Including pain rating scale, medication(s)/side effects and non-pharmacologic comfort measures 05/22/2022 1519 by Warren Lacy, RN Outcome: Adequate for Discharge 05/22/2022 1423 by Warren Lacy, RN Outcome: Progressing   Problem: Health Behavior/Discharge Planning: Goal: Ability to manage health-related needs will improve 05/22/2022 1519 by Warren Lacy, RN Outcome: Adequate for Discharge 05/22/2022 1423 by Warren Lacy, RN Outcome: Progressing   Problem: Clinical Measurements: Goal: Ability to maintain clinical measurements within normal limits will improve 05/22/2022 1519 by Warren Lacy, RN Outcome: Adequate for Discharge 05/22/2022 1423 by Warren Lacy, RN Outcome: Progressing Goal: Will remain free from infection 05/22/2022 1519 by Warren Lacy, RN Outcome: Adequate for Discharge 05/22/2022 1423 by Warren Lacy, RN Outcome: Progressing Goal: Diagnostic test results will improve 05/22/2022 1519 by Warren Lacy, RN Outcome: Adequate for Discharge 05/22/2022 1423 by Warren Lacy, RN Outcome: Progressing Goal: Respiratory complications will improve 05/22/2022 1519 by Warren Lacy, RN Outcome: Adequate for Discharge 05/22/2022 1423 by Warren Lacy, RN Outcome: Progressing Goal: Cardiovascular complication will be avoided 05/22/2022 1519 by Warren Lacy, RN Outcome: Adequate for Discharge 05/22/2022 1423 by Warren Lacy, RN Outcome: Progressing   Problem: Activity: Goal: Risk for activity intolerance will decrease 05/22/2022 1519 by Warren Lacy, RN Outcome: Adequate for Discharge 05/22/2022 1423 by Warren Lacy, RN Outcome: Progressing   Problem: Nutrition: Goal: Adequate nutrition will be maintained 05/22/2022 1519 by Warren Lacy, RN Outcome: Adequate for Discharge 05/22/2022 1423 by Warren Lacy, RN Outcome: Progressing   Problem: Coping: Goal: Level of anxiety will decrease 05/22/2022 1519 by Warren Lacy, RN Outcome: Adequate for Discharge 05/22/2022 1423 by Warren Lacy, RN Outcome: Progressing   Problem: Elimination: Goal: Will not experience complications  related to bowel motility 05/22/2022 1519 by Warren Lacy, RN Outcome: Adequate for Discharge 05/22/2022 1423 by Warren Lacy, RN Outcome: Progressing Goal: Will not experience complications related to urinary retention 05/22/2022 1519 by Warren Lacy, RN  Outcome: Adequate for Discharge 05/22/2022 1423 by Warren Lacy, RN Outcome: Progressing   Problem: Pain Managment: Goal: General experience of comfort will improve 05/22/2022 1519 by Warren Lacy, RN Outcome: Adequate for Discharge 05/22/2022 1423 by Warren Lacy, RN Outcome: Progressing   Problem: Safety: Goal: Ability to remain free from injury will improve 05/22/2022 1519 by Warren Lacy, RN Outcome: Adequate for Discharge 05/22/2022 1423 by Warren Lacy, RN Outcome: Progressing   Problem: Skin Integrity: Goal: Risk for impaired skin integrity will decrease 05/22/2022 1519 by Warren Lacy, RN Outcome: Adequate for Discharge 05/22/2022 1423 by Warren Lacy, RN Outcome: Progressing

## 2022-05-23 ENCOUNTER — Encounter (HOSPITAL_COMMUNITY): Payer: Self-pay | Admitting: Orthopedic Surgery

## 2022-05-23 NOTE — Discharge Summary (Signed)
Patient ID: Mark Hall MRN: [id number] DOB/AGE: February 02, 1941 81 y.o.  Admit date: 05/21/2022 Discharge date: 05/22/2022  Admission Diagnoses:  Principal Problem:   OA (osteoarthritis) of knee Active Problems:   Primary osteoarthritis of left knee   Discharge Diagnoses:  Same  Past Medical History:  Diagnosis Date   Arthritis    Cancer ([place])    Hypertension    Pneumonia    Pre-diabetes     Surgeries: Procedure(s): TOTAL KNEE ARTHROPLASTY on 05/21/2022   Consultants:   Discharged Condition: Improved  Hospital Course: Mell Guia is an 81 y.o. male who was admitted 05/21/2022 for operative treatment ofOA (osteoarthritis) of knee. Patient has severe unremitting pain that affects sleep, daily activities, and work/hobbies. After pre-op clearance the patient was taken to the operating room on 05/21/2022 and underwent  Procedure(s): TOTAL KNEE ARTHROPLASTY.    Patient was given perioperative antibiotics:  Anti-infectives (From admission, onward)    Start     Dose/Rate Route Frequency Ordered Stop   05/21/22 2000  ceFAZolin (ANCEF) IVPB 2g/100 mL premix        2 g 200 mL/hr over 30 Minutes Intravenous Every 6 hours 05/21/22 1816 05/22/22 0220   05/21/22 1145  ceFAZolin (ANCEF) IVPB 2g/100 mL premix        2 g 200 mL/hr over 30 Minutes Intravenous On call to O.R. 05/21/22 1143 05/21/22 1407        Patient was given sequential compression devices, early ambulation, and chemoprophylaxis to prevent DVT.  Patient benefited maximally from hospital stay and there were no complications.    Recent vital signs: Patient Vitals for the past 24 hrs:  BP Temp Temp src Pulse Resp SpO2  05/22/22 1313 130/64 98.7 F (37.1 C) Oral 97 18 93 %  05/22/22 0915 128/74 98.3 F (36.8 C) Oral (!) 106 18 91 %     Recent laboratory studies:  Recent Labs    05/22/22 0317  WBC 18.6*  HGB 12.6*  HCT 37.8*  PLT 247  NA 135  K 4.4  CL 102  CO2 28  BUN 17  CREATININE 0.90  GLUCOSE 175*   CALCIUM 8.7*     Discharge Medications:   Allergies as of 05/22/2022       Reactions   Sulfa Antibiotics Other (See Comments)   Unsure years ago        Medication List     STOP taking these medications    acetaminophen 500 MG tablet Commonly known as: TYLENOL   naproxen sodium 220 MG tablet Commonly known as: ALEVE       TAKE these medications    amoxicillin 500 MG capsule Commonly known as: AMOXIL Take 2,000 mg by mouth See admin instructions. Take 4 capsules (2000 mg) by mouth 1 hour prior to dental appointments   aspirin EC 325 MG tablet Take 1 tablet (325 mg total) by mouth 2 (two) times daily for 20 days. Then take one 81 mg aspirin once a day for three weeks. Then discontinue aspirin.   lisinopril 20 MG tablet Commonly known as: ZESTRIL Take 20 mg by mouth in the morning.   Magnesium 250 MG Tabs Take 250 mg by mouth at bedtime.   melatonin 5 MG Tabs Take 5 mg by mouth at bedtime.   methocarbamol 500 MG tablet Commonly known as: ROBAXIN Take 1 tablet (500 mg total) by mouth every 6 (six) hours as needed for muscle spasms.   oxyCODONE 5 MG immediate release tablet Commonly known as: Oxy IR/ROXICODONE  Take 1-2 tablets by mouth every 6 hours as needed for severe pain. Not to exceed 6 tablets a day.   simvastatin 10 MG tablet Commonly known as: ZOCOR Take 10 mg by mouth every evening.   traMADol 50 MG tablet Commonly known as: ULTRAM Take 1-2 tablets (50-100 mg total) by mouth every 6 (six) hours as needed for moderate pain.   Vitamin D3 50 MCG (2000 UT) Tabs Take 2,000 Units by mouth at bedtime.               Discharge Care Instructions  (From admission, onward)           Start     Ordered   05/22/22 0000  Weight bearing as tolerated        05/22/22 0753   05/22/22 0000  Change dressing       Comments: You may remove the bulky bandage (ACE wrap and gauze) two days after surgery. You will have an adhesive waterproof bandage  underneath. Leave this in place until your first follow-up appointment.   05/22/22 0753            Diagnostic Studies: No results found.  Disposition: Discharge disposition: 01-Home or Self Care       Discharge Instructions     Call MD / Call 911   Complete by: As directed    If you experience chest pain or shortness of breath, CALL 911 and be transported to the hospital emergency room.  If you develope a fever above 101 F, pus (white drainage) or increased drainage or redness at the wound, or calf pain, call your surgeon's office.   Change dressing   Complete by: As directed    You may remove the bulky bandage (ACE wrap and gauze) two days after surgery. You will have an adhesive waterproof bandage underneath. Leave this in place until your first follow-up appointment.   Constipation Prevention   Complete by: As directed    Drink plenty of fluids.  Prune juice may be helpful.  You may use a stool softener, such as Colace (over the counter) 100 mg twice a day.  Use MiraLax (over the counter) for constipation as needed.   Diet - low sodium heart healthy   Complete by: As directed    Do not put a pillow under the knee. Place it under the heel.   Complete by: As directed    Driving restrictions   Complete by: As directed    No driving for two weeks   Post-operative opioid taper instructions:   Complete by: As directed    POST-OPERATIVE OPIOID TAPER INSTRUCTIONS: It is important to wean off of your opioid medication as soon as possible. If you do not need pain medication after your surgery it is ok to stop day one. Opioids include: Codeine, Hydrocodone(Norco, Vicodin), Oxycodone(Percocet, oxycontin) and hydromorphone amongst others.  Long term and even short term use of opiods can cause: Increased pain response Dependence Constipation Depression Respiratory depression And more.  Withdrawal symptoms can include Flu like symptoms Nausea, vomiting And more Techniques to  manage these symptoms Hydrate well Eat regular healthy meals Stay active Use relaxation techniques(deep breathing, meditating, yoga) Do Not substitute Alcohol to help with tapering If you have been on opioids for less than two weeks and do not have pain than it is ok to stop all together.  Plan to wean off of opioids This plan should start within one week post op of your joint replacement. Maintain the  same interval or time between taking each dose and first decrease the dose.  Cut the total daily intake of opioids by one tablet each day Next start to increase the time between doses. The last dose that should be eliminated is the evening dose.      TED hose   Complete by: As directed    Use stockings (TED hose) for three weeks on both leg(s).  You may remove them at night for sleeping.   Weight bearing as tolerated   Complete by: As directed         Follow-up Information     Gaynelle Arabian, MD. Go on 06/05/2022.   Specialty: Orthopedic Surgery Why: You are scheduled for first post op appointment on Tuesday June 6th at 1:30pm. Contact information: [address] [place] [address] [phone]                  Signed: Theresa Duty 05/23/2022, 7:51 AM

## 2022-05-24 DIAGNOSIS — M6281 Muscle weakness (generalized): Secondary | ICD-10-CM | POA: Diagnosis not present

## 2022-05-24 DIAGNOSIS — M25662 Stiffness of left knee, not elsewhere classified: Secondary | ICD-10-CM | POA: Diagnosis not present

## 2022-05-24 DIAGNOSIS — R2689 Other abnormalities of gait and mobility: Secondary | ICD-10-CM | POA: Diagnosis not present

## 2022-05-24 DIAGNOSIS — M25562 Pain in left knee: Secondary | ICD-10-CM | POA: Diagnosis not present

## 2022-05-28 DIAGNOSIS — R2689 Other abnormalities of gait and mobility: Secondary | ICD-10-CM | POA: Diagnosis not present

## 2022-05-28 DIAGNOSIS — M25662 Stiffness of left knee, not elsewhere classified: Secondary | ICD-10-CM | POA: Diagnosis not present

## 2022-05-28 DIAGNOSIS — M25562 Pain in left knee: Secondary | ICD-10-CM | POA: Diagnosis not present

## 2022-05-28 DIAGNOSIS — M6281 Muscle weakness (generalized): Secondary | ICD-10-CM | POA: Diagnosis not present

## 2022-05-29 ENCOUNTER — Other Ambulatory Visit (HOSPITAL_COMMUNITY): Payer: Self-pay

## 2022-06-01 DIAGNOSIS — M25562 Pain in left knee: Secondary | ICD-10-CM | POA: Diagnosis not present

## 2022-06-01 DIAGNOSIS — M6281 Muscle weakness (generalized): Secondary | ICD-10-CM | POA: Diagnosis not present

## 2022-06-01 DIAGNOSIS — R2689 Other abnormalities of gait and mobility: Secondary | ICD-10-CM | POA: Diagnosis not present

## 2022-06-01 DIAGNOSIS — M25662 Stiffness of left knee, not elsewhere classified: Secondary | ICD-10-CM | POA: Diagnosis not present

## 2022-06-04 DIAGNOSIS — R2689 Other abnormalities of gait and mobility: Secondary | ICD-10-CM | POA: Diagnosis not present

## 2022-06-04 DIAGNOSIS — M25662 Stiffness of left knee, not elsewhere classified: Secondary | ICD-10-CM | POA: Diagnosis not present

## 2022-06-04 DIAGNOSIS — M6281 Muscle weakness (generalized): Secondary | ICD-10-CM | POA: Diagnosis not present

## 2022-06-04 DIAGNOSIS — M25562 Pain in left knee: Secondary | ICD-10-CM | POA: Diagnosis not present

## 2022-06-06 DIAGNOSIS — R2689 Other abnormalities of gait and mobility: Secondary | ICD-10-CM | POA: Diagnosis not present

## 2022-06-06 DIAGNOSIS — M6281 Muscle weakness (generalized): Secondary | ICD-10-CM | POA: Diagnosis not present

## 2022-06-06 DIAGNOSIS — M25662 Stiffness of left knee, not elsewhere classified: Secondary | ICD-10-CM | POA: Diagnosis not present

## 2022-06-06 DIAGNOSIS — M25562 Pain in left knee: Secondary | ICD-10-CM | POA: Diagnosis not present

## 2022-06-08 DIAGNOSIS — M25562 Pain in left knee: Secondary | ICD-10-CM | POA: Diagnosis not present

## 2022-06-08 DIAGNOSIS — M25662 Stiffness of left knee, not elsewhere classified: Secondary | ICD-10-CM | POA: Diagnosis not present

## 2022-06-08 DIAGNOSIS — M6281 Muscle weakness (generalized): Secondary | ICD-10-CM | POA: Diagnosis not present

## 2022-06-08 DIAGNOSIS — R2689 Other abnormalities of gait and mobility: Secondary | ICD-10-CM | POA: Diagnosis not present

## 2022-06-11 DIAGNOSIS — M25562 Pain in left knee: Secondary | ICD-10-CM | POA: Diagnosis not present

## 2022-06-11 DIAGNOSIS — M25662 Stiffness of left knee, not elsewhere classified: Secondary | ICD-10-CM | POA: Diagnosis not present

## 2022-06-11 DIAGNOSIS — M6281 Muscle weakness (generalized): Secondary | ICD-10-CM | POA: Diagnosis not present

## 2022-06-11 DIAGNOSIS — R2689 Other abnormalities of gait and mobility: Secondary | ICD-10-CM | POA: Diagnosis not present

## 2022-06-13 DIAGNOSIS — M25662 Stiffness of left knee, not elsewhere classified: Secondary | ICD-10-CM | POA: Diagnosis not present

## 2022-06-13 DIAGNOSIS — R2689 Other abnormalities of gait and mobility: Secondary | ICD-10-CM | POA: Diagnosis not present

## 2022-06-13 DIAGNOSIS — M6281 Muscle weakness (generalized): Secondary | ICD-10-CM | POA: Diagnosis not present

## 2022-06-13 DIAGNOSIS — M25562 Pain in left knee: Secondary | ICD-10-CM | POA: Diagnosis not present

## 2022-06-15 DIAGNOSIS — M25562 Pain in left knee: Secondary | ICD-10-CM | POA: Diagnosis not present

## 2022-06-15 DIAGNOSIS — M25662 Stiffness of left knee, not elsewhere classified: Secondary | ICD-10-CM | POA: Diagnosis not present

## 2022-06-15 DIAGNOSIS — M6281 Muscle weakness (generalized): Secondary | ICD-10-CM | POA: Diagnosis not present

## 2022-06-15 DIAGNOSIS — R2689 Other abnormalities of gait and mobility: Secondary | ICD-10-CM | POA: Diagnosis not present

## 2022-06-18 DIAGNOSIS — M25562 Pain in left knee: Secondary | ICD-10-CM | POA: Diagnosis not present

## 2022-06-18 DIAGNOSIS — R2689 Other abnormalities of gait and mobility: Secondary | ICD-10-CM | POA: Diagnosis not present

## 2022-06-18 DIAGNOSIS — M25662 Stiffness of left knee, not elsewhere classified: Secondary | ICD-10-CM | POA: Diagnosis not present

## 2022-06-18 DIAGNOSIS — M6281 Muscle weakness (generalized): Secondary | ICD-10-CM | POA: Diagnosis not present

## 2022-06-20 DIAGNOSIS — R2689 Other abnormalities of gait and mobility: Secondary | ICD-10-CM | POA: Diagnosis not present

## 2022-06-20 DIAGNOSIS — M25662 Stiffness of left knee, not elsewhere classified: Secondary | ICD-10-CM | POA: Diagnosis not present

## 2022-06-20 DIAGNOSIS — M6281 Muscle weakness (generalized): Secondary | ICD-10-CM | POA: Diagnosis not present

## 2022-06-20 DIAGNOSIS — M25562 Pain in left knee: Secondary | ICD-10-CM | POA: Diagnosis not present

## 2022-06-22 DIAGNOSIS — M6281 Muscle weakness (generalized): Secondary | ICD-10-CM | POA: Diagnosis not present

## 2022-06-22 DIAGNOSIS — M25562 Pain in left knee: Secondary | ICD-10-CM | POA: Diagnosis not present

## 2022-06-22 DIAGNOSIS — R2689 Other abnormalities of gait and mobility: Secondary | ICD-10-CM | POA: Diagnosis not present

## 2022-06-22 DIAGNOSIS — M25662 Stiffness of left knee, not elsewhere classified: Secondary | ICD-10-CM | POA: Diagnosis not present

## 2022-06-26 DIAGNOSIS — Z5189 Encounter for other specified aftercare: Secondary | ICD-10-CM | POA: Diagnosis not present

## 2022-06-27 DIAGNOSIS — M25562 Pain in left knee: Secondary | ICD-10-CM | POA: Diagnosis not present

## 2022-06-27 DIAGNOSIS — M6281 Muscle weakness (generalized): Secondary | ICD-10-CM | POA: Diagnosis not present

## 2022-06-27 DIAGNOSIS — R2689 Other abnormalities of gait and mobility: Secondary | ICD-10-CM | POA: Diagnosis not present

## 2022-06-27 DIAGNOSIS — M25662 Stiffness of left knee, not elsewhere classified: Secondary | ICD-10-CM | POA: Diagnosis not present

## 2022-06-29 DIAGNOSIS — M6281 Muscle weakness (generalized): Secondary | ICD-10-CM | POA: Diagnosis not present

## 2022-06-29 DIAGNOSIS — R2689 Other abnormalities of gait and mobility: Secondary | ICD-10-CM | POA: Diagnosis not present

## 2022-06-29 DIAGNOSIS — M25662 Stiffness of left knee, not elsewhere classified: Secondary | ICD-10-CM | POA: Diagnosis not present

## 2022-06-29 DIAGNOSIS — M25562 Pain in left knee: Secondary | ICD-10-CM | POA: Diagnosis not present

## 2022-07-04 DIAGNOSIS — M25562 Pain in left knee: Secondary | ICD-10-CM | POA: Diagnosis not present

## 2022-07-04 DIAGNOSIS — M25662 Stiffness of left knee, not elsewhere classified: Secondary | ICD-10-CM | POA: Diagnosis not present

## 2022-07-04 DIAGNOSIS — M6281 Muscle weakness (generalized): Secondary | ICD-10-CM | POA: Diagnosis not present

## 2022-07-04 DIAGNOSIS — R2689 Other abnormalities of gait and mobility: Secondary | ICD-10-CM | POA: Diagnosis not present

## 2022-07-05 DIAGNOSIS — L905 Scar conditions and fibrosis of skin: Secondary | ICD-10-CM | POA: Diagnosis not present

## 2022-07-05 DIAGNOSIS — L814 Other melanin hyperpigmentation: Secondary | ICD-10-CM | POA: Diagnosis not present

## 2022-07-05 DIAGNOSIS — L570 Actinic keratosis: Secondary | ICD-10-CM | POA: Diagnosis not present

## 2022-07-05 DIAGNOSIS — L578 Other skin changes due to chronic exposure to nonionizing radiation: Secondary | ICD-10-CM | POA: Diagnosis not present

## 2022-07-05 DIAGNOSIS — X32XXXS Exposure to sunlight, sequela: Secondary | ICD-10-CM | POA: Diagnosis not present

## 2022-07-05 DIAGNOSIS — Z85828 Personal history of other malignant neoplasm of skin: Secondary | ICD-10-CM | POA: Diagnosis not present

## 2022-07-05 DIAGNOSIS — Z86008 Personal history of in-situ neoplasm of other site: Secondary | ICD-10-CM | POA: Diagnosis not present

## 2022-07-05 DIAGNOSIS — L821 Other seborrheic keratosis: Secondary | ICD-10-CM | POA: Diagnosis not present

## 2022-07-06 DIAGNOSIS — R2689 Other abnormalities of gait and mobility: Secondary | ICD-10-CM | POA: Diagnosis not present

## 2022-07-06 DIAGNOSIS — M25562 Pain in left knee: Secondary | ICD-10-CM | POA: Diagnosis not present

## 2022-07-06 DIAGNOSIS — M6281 Muscle weakness (generalized): Secondary | ICD-10-CM | POA: Diagnosis not present

## 2022-07-06 DIAGNOSIS — M25662 Stiffness of left knee, not elsewhere classified: Secondary | ICD-10-CM | POA: Diagnosis not present

## 2022-07-09 DIAGNOSIS — M25662 Stiffness of left knee, not elsewhere classified: Secondary | ICD-10-CM | POA: Diagnosis not present

## 2022-07-09 DIAGNOSIS — M25562 Pain in left knee: Secondary | ICD-10-CM | POA: Diagnosis not present

## 2022-07-09 DIAGNOSIS — R2689 Other abnormalities of gait and mobility: Secondary | ICD-10-CM | POA: Diagnosis not present

## 2022-07-09 DIAGNOSIS — M6281 Muscle weakness (generalized): Secondary | ICD-10-CM | POA: Diagnosis not present

## 2022-07-12 DIAGNOSIS — R2689 Other abnormalities of gait and mobility: Secondary | ICD-10-CM | POA: Diagnosis not present

## 2022-07-12 DIAGNOSIS — M25562 Pain in left knee: Secondary | ICD-10-CM | POA: Diagnosis not present

## 2022-07-12 DIAGNOSIS — M6281 Muscle weakness (generalized): Secondary | ICD-10-CM | POA: Diagnosis not present

## 2022-07-12 DIAGNOSIS — M25662 Stiffness of left knee, not elsewhere classified: Secondary | ICD-10-CM | POA: Diagnosis not present

## 2022-09-20 DIAGNOSIS — Z0000 Encounter for general adult medical examination without abnormal findings: Secondary | ICD-10-CM | POA: Diagnosis not present

## 2022-09-20 DIAGNOSIS — Z1389 Encounter for screening for other disorder: Secondary | ICD-10-CM | POA: Diagnosis not present

## 2022-09-20 DIAGNOSIS — Z23 Encounter for immunization: Secondary | ICD-10-CM | POA: Diagnosis not present

## 2022-09-27 DIAGNOSIS — M150 Primary generalized (osteo)arthritis: Secondary | ICD-10-CM | POA: Diagnosis not present

## 2022-09-27 DIAGNOSIS — Z0000 Encounter for general adult medical examination without abnormal findings: Secondary | ICD-10-CM | POA: Diagnosis not present

## 2022-09-27 DIAGNOSIS — N529 Male erectile dysfunction, unspecified: Secondary | ICD-10-CM | POA: Diagnosis not present

## 2022-09-27 DIAGNOSIS — E559 Vitamin D deficiency, unspecified: Secondary | ICD-10-CM | POA: Diagnosis not present

## 2022-09-27 DIAGNOSIS — Z6824 Body mass index (BMI) 24.0-24.9, adult: Secondary | ICD-10-CM | POA: Diagnosis not present

## 2022-09-27 DIAGNOSIS — E782 Mixed hyperlipidemia: Secondary | ICD-10-CM | POA: Diagnosis not present

## 2022-09-27 DIAGNOSIS — Z125 Encounter for screening for malignant neoplasm of prostate: Secondary | ICD-10-CM | POA: Diagnosis not present

## 2022-09-27 DIAGNOSIS — I10 Essential (primary) hypertension: Secondary | ICD-10-CM | POA: Diagnosis not present

## 2022-09-27 DIAGNOSIS — R7303 Prediabetes: Secondary | ICD-10-CM | POA: Diagnosis not present

## 2023-01-08 DIAGNOSIS — Z85828 Personal history of other malignant neoplasm of skin: Secondary | ICD-10-CM | POA: Diagnosis not present

## 2023-01-08 DIAGNOSIS — W908XXS Exposure to other nonionizing radiation, sequela: Secondary | ICD-10-CM | POA: Diagnosis not present

## 2023-01-08 DIAGNOSIS — C44712 Basal cell carcinoma of skin of right lower limb, including hip: Secondary | ICD-10-CM | POA: Diagnosis not present

## 2023-01-08 DIAGNOSIS — L578 Other skin changes due to chronic exposure to nonionizing radiation: Secondary | ICD-10-CM | POA: Diagnosis not present

## 2023-01-08 DIAGNOSIS — L570 Actinic keratosis: Secondary | ICD-10-CM | POA: Diagnosis not present

## 2023-01-08 DIAGNOSIS — D485 Neoplasm of uncertain behavior of skin: Secondary | ICD-10-CM | POA: Diagnosis not present

## 2023-01-08 DIAGNOSIS — Z86008 Personal history of in-situ neoplasm of other site: Secondary | ICD-10-CM | POA: Diagnosis not present

## 2023-01-24 DIAGNOSIS — C44712 Basal cell carcinoma of skin of right lower limb, including hip: Secondary | ICD-10-CM | POA: Diagnosis not present

## 2023-03-20 DIAGNOSIS — E782 Mixed hyperlipidemia: Secondary | ICD-10-CM | POA: Diagnosis not present

## 2023-03-20 DIAGNOSIS — I10 Essential (primary) hypertension: Secondary | ICD-10-CM | POA: Diagnosis not present

## 2023-03-20 DIAGNOSIS — R7303 Prediabetes: Secondary | ICD-10-CM | POA: Diagnosis not present

## 2023-03-20 DIAGNOSIS — E871 Hypo-osmolality and hyponatremia: Secondary | ICD-10-CM | POA: Diagnosis not present

## 2023-04-11 DIAGNOSIS — H25813 Combined forms of age-related cataract, bilateral: Secondary | ICD-10-CM | POA: Diagnosis not present

## 2023-04-11 DIAGNOSIS — D23112 Other benign neoplasm of skin of right lower eyelid, including canthus: Secondary | ICD-10-CM | POA: Diagnosis not present

## 2023-04-11 DIAGNOSIS — H43813 Vitreous degeneration, bilateral: Secondary | ICD-10-CM | POA: Diagnosis not present

## 2023-04-11 DIAGNOSIS — H40013 Open angle with borderline findings, low risk, bilateral: Secondary | ICD-10-CM | POA: Diagnosis not present

## 2023-05-20 DIAGNOSIS — H25813 Combined forms of age-related cataract, bilateral: Secondary | ICD-10-CM | POA: Diagnosis not present

## 2023-06-01 IMAGING — CR DG HIP (WITH OR WITHOUT PELVIS) 2-3V*L*
2 series · 2 of 2 positions shown · non-contrast
Comparison: None.

CLINICAL DATA: Left hip pain for 1 year, left lower extremity
radiculopathy

EXAM:
DG HIP (WITH OR WITHOUT PELVIS) 2-3V LEFT

[w pelvis upright]
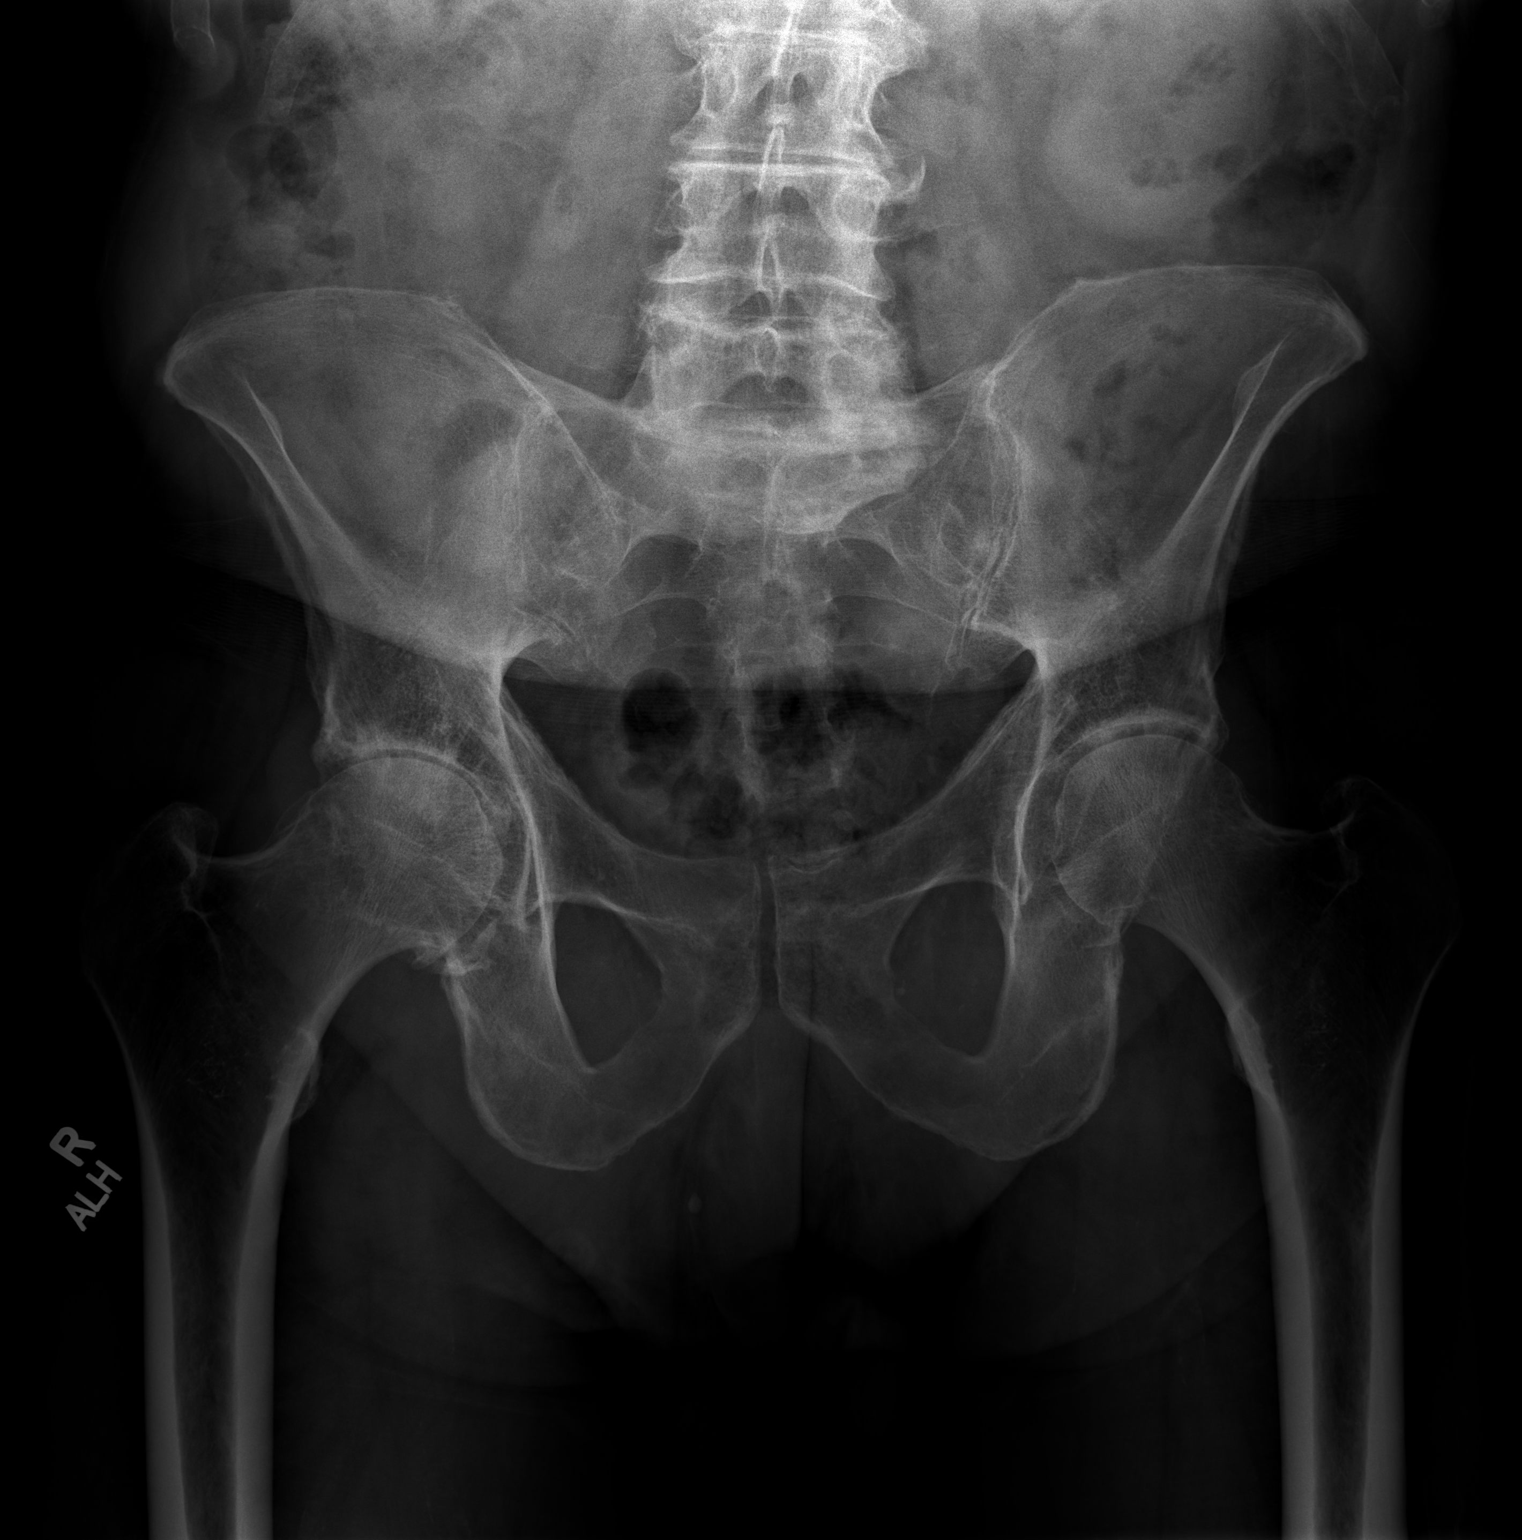

[w hip lat left]
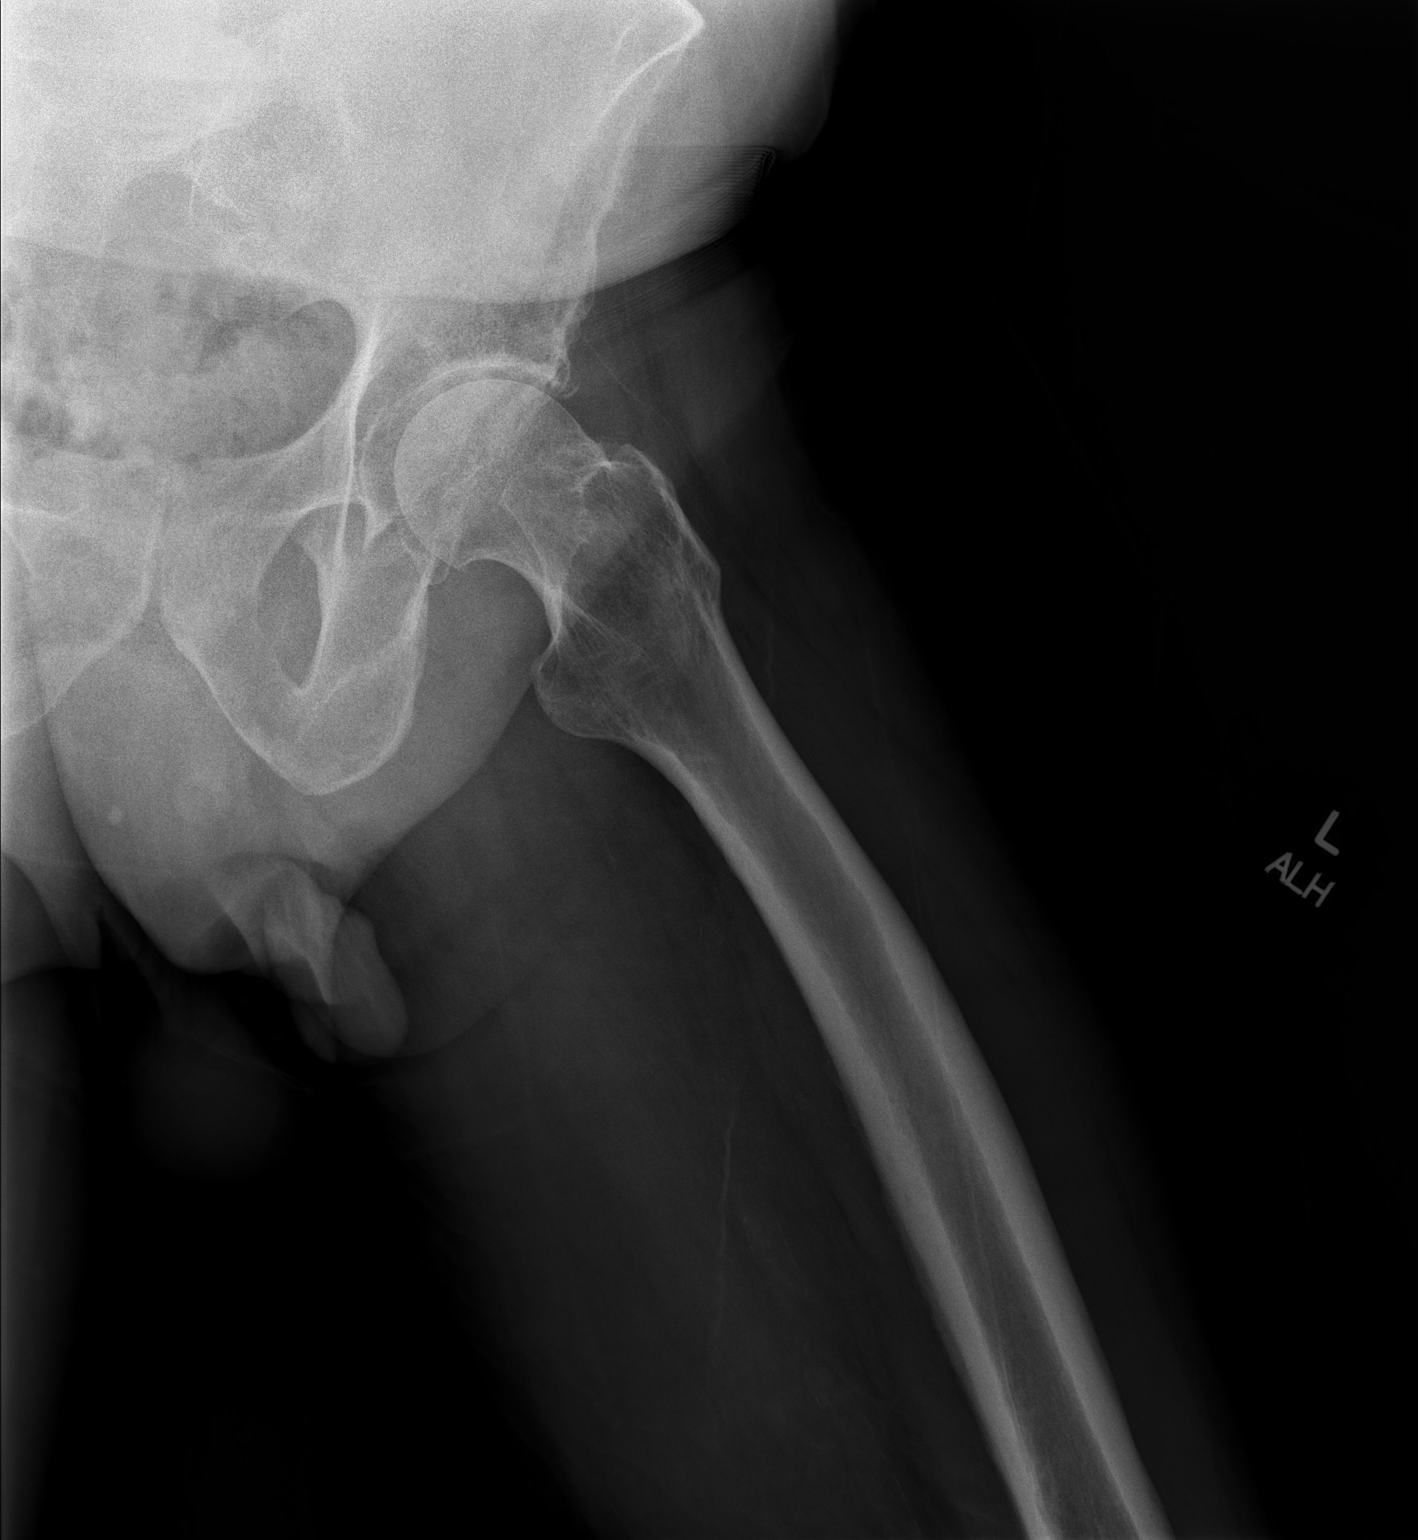

[2 of 2 positions shown; findings below may reference images not displayed]

FINDINGS: Frontal view of the pelvis as well as a frogleg lateral view of the
left hip are obtained. No acute fracture, subluxation, or
dislocation. There is bilateral hip osteoarthritis, right greater
than left. Sacroiliac joints are unremarkable. Soft tissues are
normal.
IMPRESSION: 1. Bilateral hip osteoarthritis, right greater than left.
2. No acute bony abnormality.

## 2023-06-18 DIAGNOSIS — H25812 Combined forms of age-related cataract, left eye: Secondary | ICD-10-CM | POA: Diagnosis not present

## 2023-06-27 DIAGNOSIS — H25811 Combined forms of age-related cataract, right eye: Secondary | ICD-10-CM | POA: Diagnosis not present

## 2023-07-06 DIAGNOSIS — H0012 Chalazion right lower eyelid: Secondary | ICD-10-CM | POA: Diagnosis not present

## 2023-07-30 DIAGNOSIS — Z86008 Personal history of in-situ neoplasm of other site: Secondary | ICD-10-CM | POA: Diagnosis not present

## 2023-07-30 DIAGNOSIS — D225 Melanocytic nevi of trunk: Secondary | ICD-10-CM | POA: Diagnosis not present

## 2023-07-30 DIAGNOSIS — L570 Actinic keratosis: Secondary | ICD-10-CM | POA: Diagnosis not present

## 2023-07-30 DIAGNOSIS — L821 Other seborrheic keratosis: Secondary | ICD-10-CM | POA: Diagnosis not present

## 2023-07-30 DIAGNOSIS — Z85828 Personal history of other malignant neoplasm of skin: Secondary | ICD-10-CM | POA: Diagnosis not present

## 2023-07-30 DIAGNOSIS — D485 Neoplasm of uncertain behavior of skin: Secondary | ICD-10-CM | POA: Diagnosis not present

## 2023-07-30 DIAGNOSIS — C44612 Basal cell carcinoma of skin of right upper limb, including shoulder: Secondary | ICD-10-CM | POA: Diagnosis not present

## 2023-08-20 DIAGNOSIS — H268 Other specified cataract: Secondary | ICD-10-CM | POA: Diagnosis not present

## 2023-08-20 DIAGNOSIS — H25811 Combined forms of age-related cataract, right eye: Secondary | ICD-10-CM | POA: Diagnosis not present

## 2023-08-22 DIAGNOSIS — C44612 Basal cell carcinoma of skin of right upper limb, including shoulder: Secondary | ICD-10-CM | POA: Diagnosis not present

## 2023-10-03 DIAGNOSIS — E559 Vitamin D deficiency, unspecified: Secondary | ICD-10-CM | POA: Diagnosis not present

## 2023-10-03 DIAGNOSIS — R7303 Prediabetes: Secondary | ICD-10-CM | POA: Diagnosis not present

## 2023-10-03 DIAGNOSIS — N529 Male erectile dysfunction, unspecified: Secondary | ICD-10-CM | POA: Diagnosis not present

## 2023-10-03 DIAGNOSIS — Z0000 Encounter for general adult medical examination without abnormal findings: Secondary | ICD-10-CM | POA: Diagnosis not present

## 2023-10-03 DIAGNOSIS — I10 Essential (primary) hypertension: Secondary | ICD-10-CM | POA: Diagnosis not present

## 2023-10-03 DIAGNOSIS — Z125 Encounter for screening for malignant neoplasm of prostate: Secondary | ICD-10-CM | POA: Diagnosis not present

## 2023-10-03 DIAGNOSIS — E782 Mixed hyperlipidemia: Secondary | ICD-10-CM | POA: Diagnosis not present

## 2023-12-19 DIAGNOSIS — K4090 Unilateral inguinal hernia, without obstruction or gangrene, not specified as recurrent: Secondary | ICD-10-CM | POA: Diagnosis not present

## 2024-01-07 DIAGNOSIS — K4090 Unilateral inguinal hernia, without obstruction or gangrene, not specified as recurrent: Secondary | ICD-10-CM | POA: Diagnosis not present

## 2024-01-07 DIAGNOSIS — Z6825 Body mass index (BMI) 25.0-25.9, adult: Secondary | ICD-10-CM | POA: Diagnosis not present

## 2024-01-09 DIAGNOSIS — L570 Actinic keratosis: Secondary | ICD-10-CM | POA: Diagnosis not present

## 2024-01-09 DIAGNOSIS — L578 Other skin changes due to chronic exposure to nonionizing radiation: Secondary | ICD-10-CM | POA: Diagnosis not present

## 2024-01-09 DIAGNOSIS — L821 Other seborrheic keratosis: Secondary | ICD-10-CM | POA: Diagnosis not present

## 2024-01-09 DIAGNOSIS — Z85828 Personal history of other malignant neoplasm of skin: Secondary | ICD-10-CM | POA: Diagnosis not present

## 2024-01-09 DIAGNOSIS — L905 Scar conditions and fibrosis of skin: Secondary | ICD-10-CM | POA: Diagnosis not present

## 2024-01-29 DIAGNOSIS — K4090 Unilateral inguinal hernia, without obstruction or gangrene, not specified as recurrent: Secondary | ICD-10-CM | POA: Diagnosis not present

## 2024-01-29 DIAGNOSIS — Z6825 Body mass index (BMI) 25.0-25.9, adult: Secondary | ICD-10-CM | POA: Diagnosis not present

## 2024-02-19 ENCOUNTER — Ambulatory Visit: Payer: Self-pay | Admitting: Surgery

## 2024-02-19 DIAGNOSIS — K4090 Unilateral inguinal hernia, without obstruction or gangrene, not specified as recurrent: Secondary | ICD-10-CM | POA: Diagnosis not present

## 2024-02-19 NOTE — H&P (Signed)
Mark Hall [id number]    Referring Provider:  Gasper Lloyd, NP     Subjective    Chief Complaint: New Consultation ( LEFT INGUINAL HERNIA.)       History of Present Illness:    83 year old man with history of arthritis, hypertension, hyperlipidemia, prediabetes and previous abdominal surgery including cholecystectomy who presents for evaluation of a left inguinal hernia.  He first noticed this just after the new year and it has been reducible; he has been wearing a hernia belt.  He has experienced some pain associated with the hernia when it protrudes.  No GI or urinary symptoms.       Review of Systems: A complete review of systems was obtained from the patient.  I have reviewed this information and discussed as appropriate with the patient.  See HPI as well for other ROS.     Medical History: Past Medical History      Past Medical History:  Diagnosis Date   History of cancer           Problem List  There is no problem list on file for this patient.      Past Surgical History       Past Surgical History:  Procedure Laterality Date   CHOLECYSTECTOMY            Allergies       Allergies  Allergen Reactions   Sulfa (Sulfonamide Antibiotics) Other (See Comments)      Unsure years ago        Medications Ordered Prior to Encounter        Current Outpatient Medications on File Prior to Visit  Medication Sig Dispense Refill   acetaminophen (TYLENOL) 325 MG tablet Take 325 mg by mouth       lisinopriL (ZESTRIL) 20 MG tablet Take 20 mg by mouth once daily       magnesium 250 mg Tab Take 250 mg by mouth       simvastatin (ZOCOR) 10 MG tablet Take 1 tablet by mouth once daily        No current facility-administered medications on file prior to visit.        Family History       Family History  Problem Relation Age of Onset   Skin cancer Mother     High blood pressure (Hypertension) Mother          Tobacco Use History  Social History        Tobacco Use  Smoking Status Never  Smokeless Tobacco Never        Social History  Social History         Socioeconomic History   Marital status: Married  Tobacco Use   Smoking status: Never   Smokeless tobacco: Never  Substance and Sexual Activity   Alcohol use: Yes      Alcohol/week: 2.0 standard drinks of alcohol      Types: 2 Standard drinks or equivalent per week   Drug use: Never    Social Drivers of Health          Received from [employer]    Family and [employer] Support  Housing Stability: Unknown (02/19/2024)    Housing Stability Vital Sign     Homeless in the Last Year: No        Objective:         Vitals:    02/19/24 1103  BP: (!) 150/78  Pulse: 88  Temp: 36.1 C (97  F)  SpO2: 99%  Weight: 78 kg (172 lb)  Height: 175.3 cm (5\' 9" )    Body mass index is 25.4 kg/m.   Gen: A&Ox3, no distress  Unlabored respirations Moderate reducible left inguinal hernia.  No hernia on the right.   Assessment and Plan:  Diagnoses and all orders for this visit:   Non-recurrent unilateral inguinal hernia without obstruction or gangrene     We discussed the relevant anatomy and we discussed options for repair.  I recommend an open approach and went over the technique of the procedure.  Discussed risks of bleeding, infection, pain, scarring, injury to structures in the area including nerves, blood vessels, bowel, bladder, risk of chronic pain, hernia recurrence, risk of seroma or hematoma, urinary retention, and risks of general anesthesia including cardiovascular, pulmonary, and thromboembolic complications.  We discussed typical postop recovery, timeline, and activity limitations.  We also discussed the option of ongoing observation, with high rate of ultimately returning for surgery and risk of increasing size/symptoms from the hernia as well as incarceration/strangulation and went over symptoms that should prompt him to seek emergency treatment.   Questions were come and answered to the patient's satisfaction. Patient wishes to proceed with scheduling.       Mashonda Broski Carlye Grippe, MD

## 2024-02-19 NOTE — H&P (View-Only) (Signed)
 Mark Hall [id number]    Referring Provider:  Gasper Lloyd, NP     Subjective    Chief Complaint: New Consultation ( LEFT INGUINAL HERNIA.)       History of Present Illness:    83 year old man with history of arthritis, hypertension, hyperlipidemia, prediabetes and previous abdominal surgery including cholecystectomy who presents for evaluation of a left inguinal hernia.  He first noticed this just after the new year and it has been reducible; he has been wearing a hernia belt.  He has experienced some pain associated with the hernia when it protrudes.  No GI or urinary symptoms.       Review of Systems: A complete review of systems was obtained from the patient.  I have reviewed this information and discussed as appropriate with the patient.  See HPI as well for other ROS.     Medical History: Past Medical History      Past Medical History:  Diagnosis Date   History of cancer           Problem List  There is no problem list on file for this patient.      Past Surgical History       Past Surgical History:  Procedure Laterality Date   CHOLECYSTECTOMY            Allergies       Allergies  Allergen Reactions   Sulfa (Sulfonamide Antibiotics) Other (See Comments)      Unsure years ago        Medications Ordered Prior to Encounter        Current Outpatient Medications on File Prior to Visit  Medication Sig Dispense Refill   acetaminophen (TYLENOL) 325 MG tablet Take 325 mg by mouth       lisinopriL (ZESTRIL) 20 MG tablet Take 20 mg by mouth once daily       magnesium 250 mg Tab Take 250 mg by mouth       simvastatin (ZOCOR) 10 MG tablet Take 1 tablet by mouth once daily        No current facility-administered medications on file prior to visit.        Family History       Family History  Problem Relation Age of Onset   Skin cancer Mother     High blood pressure (Hypertension) Mother          Tobacco Use History  Social History        Tobacco Use  Smoking Status Never  Smokeless Tobacco Never        Social History  Social History         Socioeconomic History   Marital status: Married  Tobacco Use   Smoking status: Never   Smokeless tobacco: Never  Substance and Sexual Activity   Alcohol use: Yes      Alcohol/week: 2.0 standard drinks of alcohol      Types: 2 Standard drinks or equivalent per week   Drug use: Never    Social Drivers of Health          Received from [employer]    Family and [employer] Support  Housing Stability: Unknown (02/19/2024)    Housing Stability Vital Sign     Homeless in the Last Year: No        Objective:         Vitals:    02/19/24 1103  BP: (!) 150/78  Pulse: 88  Temp: 36.1 C (97  F)  SpO2: 99%  Weight: 78 kg (172 lb)  Height: 175.3 cm (5\' 9" )    Body mass index is 25.4 kg/m.   Gen: A&Ox3, no distress  Unlabored respirations Moderate reducible left inguinal hernia.  No hernia on the right.   Assessment and Plan:  Diagnoses and all orders for this visit:   Non-recurrent unilateral inguinal hernia without obstruction or gangrene     We discussed the relevant anatomy and we discussed options for repair.  I recommend an open approach and went over the technique of the procedure.  Discussed risks of bleeding, infection, pain, scarring, injury to structures in the area including nerves, blood vessels, bowel, bladder, risk of chronic pain, hernia recurrence, risk of seroma or hematoma, urinary retention, and risks of general anesthesia including cardiovascular, pulmonary, and thromboembolic complications.  We discussed typical postop recovery, timeline, and activity limitations.  We also discussed the option of ongoing observation, with high rate of ultimately returning for surgery and risk of increasing size/symptoms from the hernia as well as incarceration/strangulation and went over symptoms that should prompt him to seek emergency treatment.   Questions were come and answered to the patient's satisfaction. Patient wishes to proceed with scheduling.       Mashonda Broski Carlye Grippe, MD

## 2024-02-24 NOTE — Progress Notes (Signed)
 Anesthesia Review:  PCP: Cardiologist :  PPM/ ICD: Device Orders: Rep Notified:  Chest x-ray : EKG : Echo : Stress test: Cardiac Cath :   Activity level:  Sleep Study/ CPAP : Fasting Blood Sugar :      / Checks Blood Sugar -- times a day:     preDiabetes   Blood Thinner/ Instructions /Last Dose: ASA / Instructions/ Last Dose :

## 2024-02-24 NOTE — Patient Instructions (Signed)
 SURGICAL WAITING ROOM VISITATION  Patients having surgery or a procedure may have no more than 2 support people in the waiting area - these visitors may rotate.    Children under the age of 86 must have an adult with them who is not the patient.  Due to an increase in RSV and influenza rates and associated hospitalizations, children ages 71 and under may not visit patients in [facility] hospitals.  Visitors with respiratory illnesses are discouraged from visiting and should remain at home.  If the patient needs to stay at the hospital during part of their recovery, the visitor guidelines for inpatient rooms apply. Pre-op nurse will coordinate an appropriate time for 1 support person to accompany patient in pre-op.  This support person may not rotate.    Please refer to the [facility] website for the visitor guidelines for Inpatients (after your surgery is over and you are in a regular room).       Your procedure is scheduled on:  02/28/24    Report to [facility] Main Entrance    Report to admitting at [phone]   Call this number if you have problems the morning of surgery [phone]   Do not eat food : or drink liquids After Midnight.                If you have questions, please contact your surge     Oral Hygiene is also important to reduce your risk of infection.                                    Remember - BRUSH YOUR TEETH THE MORNING OF SURGERY WITH YOUR REGULAR TOOTHPASTE  DENTURES WILL BE REMOVED PRIOR TO SURGERY PLEASE DO NOT APPLY "Poly grip" OR ADHESIVES!!!   Do NOT smoke after Midnight   Stop all vitamins and herbal supplements 7 days before surgery.   Take these medicines the morning of surgery with A SIP OF WATER:  none   DO NOT TAKE ANY ORAL DIABETIC MEDICATIONS DAY OF YOUR SURGERY  Bring CPAP mask and tubing day of surgery.                              You may not have any metal on your body including hair pins, jewelry, and body piercing              Do not wear make-up, lotions, powders, perfumes/cologne, or deodorant  Do not wear nail polish including gel and S&S, artificial/acrylic nails, or any other type of covering on natural nails including finger and toenails. If you have artificial nails, gel coating, etc. that needs to be removed by a nail salon please have this removed prior to surgery or surgery may need to be canceled/ delayed if the surgeon/ anesthesia feels like they are unable to be safely monitored.   Do not shave  48 hours prior to surgery.               Men may shave face and neck.   Do not bring valuables to the hospital. [facility] IS NOT             RESPONSIBLE   FOR VALUABLES.   Contacts, glasses, dentures or bridgework may not be worn into surgery.   Bring small overnight bag day of surgery.   DO NOT  BRING YOUR HOME MEDICATIONS TO THE HOSPITAL. PHARMACY WILL DISPENSE MEDICATIONS LISTED ON YOUR MEDICATION LIST TO YOU DURING YOUR ADMISSION IN THE HOSPITAL!    Patients discharged on the day of surgery will not be allowed to drive home.  Someone NEEDS to stay with you for the first 24 hours after anesthesia.   Special Instructions: Bring a copy of your healthcare power of attorney and living will documents the day of surgery if you haven't scanned them before.              Please read over the following fact sheets you were given: IF YOU HAVE QUESTIONS ABOUT YOUR PRE-OP INSTRUCTIONS PLEASE CALL [phone]   If you received a COVID test during your pre-op visit  it is requested that you wear a mask when out in public, stay away from anyone that may not be feeling well and notify your surgeon if you develop symptoms. If you test positive for Covid or have been in contact with anyone that has tested positive in the last 10 days please notify you surgeon.    [facility] - Preparing for Surgery Before surgery, you can play an important role.  Because skin is not sterile, your skin needs to be as free of  germs as possible.  You can reduce the number of germs on your skin by washing with CHG (chlorahexidine gluconate) soap before surgery.  CHG is an antiseptic cleaner which kills germs and bonds with the skin to continue killing germs even after washing. Please DO NOT use if you have an allergy to CHG or antibacterial soaps.  If your skin becomes reddened/irritated stop using the CHG and inform your nurse when you arrive at Short Stay. Do not shave (including legs and underarms) for at least 48 hours prior to the first CHG shower.  You may shave your face/neck. Please follow these instructions carefully:  1.  Shower with CHG Soap the night before surgery and the  morning of Surgery.  2.  If you choose to wash your hair, wash your hair first as usual with your  normal  shampoo.  3.  After you shampoo, rinse your hair and body thoroughly to remove the  shampoo.                           4.  Use CHG as you would any other liquid soap.  You can apply chg directly  to the skin and wash                       Gently with a scrungie or clean washcloth.  5.  Apply the CHG Soap to your body ONLY FROM THE NECK DOWN.   Do not use on face/ open                           Wound or open sores. Avoid contact with eyes, ears mouth and genitals (private parts).                       Wash face,  Genitals (private parts) with your normal soap.             6.  Wash thoroughly, paying special attention to the area where your surgery  will be performed.  7.  Thoroughly rinse your body with warm water from the neck down.  8.  DO  NOT shower/wash with your normal soap after using and rinsing off  the CHG Soap.                9.  Pat yourself dry with a clean towel.            10.  Wear clean pajamas.            11.  Place clean sheets on your bed the night of your first shower and do not  sleep with pets. Day of Surgery : Do not apply any lotions/deodorants the morning of surgery.  Please wear clean clothes to the  hospital/surgery center.  FAILURE TO FOLLOW THESE INSTRUCTIONS MAY RESULT IN THE CANCELLATION OF YOUR SURGERY PATIENT SIGNATURE_________________________________  NURSE SIGNATURE__________________________________  ________________________________________________________________________

## 2024-02-26 ENCOUNTER — Other Ambulatory Visit: Payer: Self-pay

## 2024-02-26 ENCOUNTER — Encounter (HOSPITAL_COMMUNITY): Payer: Self-pay

## 2024-02-26 ENCOUNTER — Encounter (HOSPITAL_COMMUNITY)
Admission: RE | Admit: 2024-02-26 | Discharge: 2024-02-26 | Disposition: A | Discharge status: Home / Self Care | Payer: Medicare PPO | Source: Ambulatory Visit | Attending: Surgery

## 2024-02-26 VITALS — BP 165/86 | HR 68 | Temp 98.0°F | Resp 16 | Ht 69.0 in | Wt 138.0 lb

## 2024-02-26 DIAGNOSIS — Z01818 Encounter for other preprocedural examination: Secondary | ICD-10-CM | POA: Insufficient documentation

## 2024-02-26 LAB — CBC
HCT: 43.4 % (ref 39.0–52.0)
Hemoglobin: 13.9 g/dL (ref 13.0–17.0)
MCH: 32 pg (ref 26.0–34.0)
MCHC: 32 g/dL (ref 30.0–36.0)
MCV: 100 fL (ref 80.0–100.0)
Platelets: 270 10*3/uL (ref 150–400)
RBC: 4.34 MIL/uL (ref 4.22–5.81)
RDW: 12.4 % (ref 11.5–15.5)
WBC: 7.6 10*3/uL (ref 4.0–10.5)
nRBC: 0 % (ref 0.0–0.2)

## 2024-02-26 LAB — BASIC METABOLIC PANEL
Anion gap: 10 (ref 5–15)
BUN: 23 mg/dL (ref 8–23)
CO2: 25 mmol/L (ref 22–32)
Calcium: 9.2 mg/dL (ref 8.9–10.3)
Chloride: 97 mmol/L — ABNORMAL LOW (ref 98–111)
Creatinine, Ser: 0.94 mg/dL (ref 0.61–1.24)
GFR, Estimated: >60 mL/min (ref >60)
Glucose, Bld: 101 mg/dL — ABNORMAL HIGH (ref 70–99)
Potassium: 5.2 mmol/L — ABNORMAL HIGH (ref 3.5–5.1)
Sodium: 132 mmol/L — ABNORMAL LOW (ref 135–145)

## 2024-02-27 NOTE — Anesthesia Preprocedure Evaluation (Signed)
 Anesthesia Evaluation  Patient identified by MRN, date of birth, ID band Patient awake    Reviewed: Allergy & Precautions, NPO status , Patient's Chart, lab work & pertinent test results  History of Anesthesia Complications Negative for: history of anesthetic complications  Airway Mallampati: III  TM Distance: >3 FB Neck ROM: Full  Mouth opening: Limited Mouth Opening  Dental  (+) Dental Advisory Given   Pulmonary neg pulmonary ROS   Pulmonary exam normal breath sounds clear to auscultation       Cardiovascular hypertension, (-) angina (-) Past MI, (-) Cardiac Stents and (-) CABG (-) dysrhythmias + Valvular Problems/Murmurs (mild AS)  Rhythm:Regular Rate:Normal  HLD  TTE 07/25/2020:  Left ventricular wall thickness is consistent with mild concentric  hypertrophy.   Estimated left ventricular EF = 70% Estimated left ventricular EF was in  agreement with the calculated left ventricular EF. Left ventricular  ejection fraction appears to be 66 - 70%. Left ventricular systolic  function is normal.   Mild aortic valve stenosis is present.   Left ventricular diastolic function is consistent with age.   Estimated right ventricular systolic pressure from tricuspid  regurgitation is normal (<35 mmHg).   Normal right ventricular wall thickness, systolic function and septal  motion noted with the right ventricular cavity mildly dilated.   No evidence of pulmonary hypertension is present.   There is no evidence of pericardial effusion.     Neuro/Psych negative neurological ROS     GI/Hepatic negative GI ROS, Neg liver ROS,,,  Endo/Other  negative endocrine ROS    Renal/GU negative Renal ROS     Musculoskeletal  (+) Arthritis , Osteoarthritis,    Abdominal   Peds  Hematology negative hematology ROS (+) Lab Results      Component                Value               Date                      WBC                       7.6                 02/26/2024                HGB                      13.9                02/26/2024                HCT                      43.4                02/26/2024                MCV                      100.0               02/26/2024                PLT                      270  02/26/2024              Anesthesia Other Findings K 5.2  Reproductive/Obstetrics                             Anesthesia Physical Anesthesia Plan  ASA: 2  Anesthesia Plan: General   Post-op Pain Management: Tylenol PO (pre-op)*   Induction: Intravenous  PONV Risk Score and Plan: 2 and Ondansetron, Dexamethasone and Treatment may vary due to age or medical condition  Airway Management Planned: Oral ETT  Additional Equipment:   Intra-op Plan:   Post-operative Plan: Extubation in OR  Informed Consent: I have reviewed the patients History and Physical, chart, labs and discussed the procedure including the risks, benefits and alternatives for the proposed anesthesia with the patient or authorized representative who has indicated his/her understanding and acceptance.     Dental advisory given  Plan Discussed with: CRNA and Anesthesiologist  Anesthesia Plan Comments: (Risks of general anesthesia discussed including, but not limited to, sore throat, hoarse voice, chipped/damaged teeth, injury to vocal cords, nausea and vomiting, allergic reactions, lung infection, heart attack, stroke, and death. All questions answered. )        Anesthesia Quick Evaluation

## 2024-02-28 ENCOUNTER — Encounter (HOSPITAL_COMMUNITY)
Admission: RE | Disposition: A | Discharge status: Home / Self Care | Payer: Self-pay | Source: Ambulatory Visit | Attending: Surgery

## 2024-02-28 ENCOUNTER — Ambulatory Visit (HOSPITAL_BASED_OUTPATIENT_CLINIC_OR_DEPARTMENT_OTHER): Payer: Self-pay | Admitting: Anesthesiology

## 2024-02-28 ENCOUNTER — Ambulatory Visit (HOSPITAL_COMMUNITY): Payer: Self-pay | Admitting: Physician Assistant

## 2024-02-28 ENCOUNTER — Other Ambulatory Visit (HOSPITAL_COMMUNITY): Payer: Self-pay

## 2024-02-28 ENCOUNTER — Ambulatory Visit (HOSPITAL_COMMUNITY)
Admission: RE | Admit: 2024-02-28 | Discharge: 2024-02-28 | Disposition: A | Discharge status: Home / Self Care | Payer: Medicare PPO | Source: Ambulatory Visit | Attending: Surgery | Admitting: Surgery

## 2024-02-28 ENCOUNTER — Encounter (HOSPITAL_COMMUNITY): Payer: Self-pay | Admitting: Surgery

## 2024-02-28 DIAGNOSIS — Z9049 Acquired absence of other specified parts of digestive tract: Secondary | ICD-10-CM | POA: Diagnosis not present

## 2024-02-28 DIAGNOSIS — K4090 Unilateral inguinal hernia, without obstruction or gangrene, not specified as recurrent: Secondary | ICD-10-CM | POA: Diagnosis not present

## 2024-02-28 DIAGNOSIS — E785 Hyperlipidemia, unspecified: Secondary | ICD-10-CM | POA: Diagnosis not present

## 2024-02-28 DIAGNOSIS — Z01818 Encounter for other preprocedural examination: Secondary | ICD-10-CM

## 2024-02-28 DIAGNOSIS — I10 Essential (primary) hypertension: Secondary | ICD-10-CM | POA: Insufficient documentation

## 2024-02-28 DIAGNOSIS — M1990 Unspecified osteoarthritis, unspecified site: Secondary | ICD-10-CM | POA: Insufficient documentation

## 2024-02-28 HISTORY — PX: INGUINAL HERNIA REPAIR: SHX194

## 2024-02-28 SURGERY — REPAIR, HERNIA, INGUINAL, ADULT
Anesthesia: General | Site: Abdomen | Laterality: Left

## 2024-02-28 MED ORDER — DEXAMETHASONE SODIUM PHOSPHATE 10 MG/ML IJ SOLN
INTRAMUSCULAR | Status: DC | PRN
  Administered 2024-02-28: 6 mg via INTRAVENOUS

## 2024-02-28 MED ORDER — CHLORHEXIDINE GLUCONATE 4 % EX SOLN
60.0000 mL | Freq: Once | CUTANEOUS | Status: DC
Start: 2024-02-28 — End: 2024-02-28

## 2024-02-28 MED ORDER — PROPOFOL 10 MG/ML IV BOLUS
INTRAVENOUS | Status: DC | PRN
  Administered 2024-02-28: 30 mg via INTRAVENOUS
  Administered 2024-02-28: 100 mg via INTRAVENOUS

## 2024-02-28 MED ORDER — FENTANYL CITRATE (PF) 100 MCG/2ML IJ SOLN
INTRAMUSCULAR | Status: AC
  Filled 2024-02-28: qty 2

## 2024-02-28 MED ORDER — PHENYLEPHRINE HCL (PRESSORS) 10 MG/ML IV SOLN
INTRAVENOUS | Status: AC
  Filled 2024-02-28: qty 1

## 2024-02-28 MED ORDER — BUPIVACAINE LIPOSOME 1.3 % IJ SUSP
INTRAMUSCULAR | Status: DC | PRN
  Administered 2024-02-28: 20 mL

## 2024-02-28 MED ORDER — LACTATED RINGERS IV SOLN: INTRAVENOUS | Status: DC | PRN

## 2024-02-28 MED ORDER — LIDOCAINE HCL (CARDIAC) PF 100 MG/5ML IV SOSY
PREFILLED_SYRINGE | INTRAVENOUS | Status: DC | PRN
  Administered 2024-02-28: 60 mg via INTRAVENOUS

## 2024-02-28 MED ORDER — ONDANSETRON HCL 4 MG/2ML IJ SOLN
INTRAMUSCULAR | Status: AC
  Filled 2024-02-28: qty 2

## 2024-02-28 MED ORDER — ONDANSETRON HCL 4 MG/2ML IJ SOLN
INTRAMUSCULAR | Status: DC | PRN
  Administered 2024-02-28: 4 mg via INTRAVENOUS

## 2024-02-28 MED ORDER — ACETAMINOPHEN 500 MG PO TABS: 1000.0000 mg | ORAL_TABLET | Freq: Once | ORAL | Status: DC

## 2024-02-28 MED ORDER — ROCURONIUM BROMIDE 10 MG/ML (PF) SYRINGE
PREFILLED_SYRINGE | INTRAVENOUS | Status: AC
  Filled 2024-02-28: qty 10

## 2024-02-28 MED ORDER — BUPIVACAINE LIPOSOME 1.3 % IJ SUSP
20.0000 mL | Freq: Once | INTRAMUSCULAR | Status: DC
Start: 2024-02-28 — End: 2024-02-28

## 2024-02-28 MED ORDER — DEXAMETHASONE SODIUM PHOSPHATE 10 MG/ML IJ SOLN
INTRAMUSCULAR | Status: AC
  Filled 2024-02-28: qty 1

## 2024-02-28 MED ORDER — OXYCODONE HCL 5 MG PO TABS
5.0000 mg | ORAL_TABLET | Freq: Three times a day (TID) | ORAL | 0 refills | Status: AC | PRN
  Filled 2024-02-28: qty 15, 5d supply, fill #0

## 2024-02-28 MED ORDER — EPHEDRINE SULFATE-NACL 50-0.9 MG/10ML-% IV SOSY
PREFILLED_SYRINGE | INTRAVENOUS | Status: DC | PRN
  Administered 2024-02-28: 10 mg via INTRAVENOUS
  Administered 2024-02-28: 5 mg via INTRAVENOUS

## 2024-02-28 MED ORDER — LIDOCAINE HCL (PF) 2 % IJ SOLN
INTRAMUSCULAR | Status: AC
  Filled 2024-02-28: qty 5

## 2024-02-28 MED ORDER — ROCURONIUM BROMIDE 100 MG/10ML IV SOLN
INTRAVENOUS | Status: DC | PRN
  Administered 2024-02-28: 50 mg via INTRAVENOUS

## 2024-02-28 MED ORDER — PROPOFOL 10 MG/ML IV BOLUS
INTRAVENOUS | Status: AC
  Filled 2024-02-28: qty 20

## 2024-02-28 MED ORDER — CEFAZOLIN SODIUM-DEXTROSE 2-4 GM/100ML-% IV SOLN
2.0000 g | INTRAVENOUS | Status: AC
  Administered 2024-02-28: 2 g via INTRAVENOUS
  Filled 2024-02-28: qty 100

## 2024-02-28 MED ORDER — BUPIVACAINE-EPINEPHRINE (PF) 0.25% -1:200000 IJ SOLN
INTRAMUSCULAR | Status: AC
  Filled 2024-02-28: qty 30

## 2024-02-28 MED ORDER — FENTANYL CITRATE (PF) 100 MCG/2ML IJ SOLN
INTRAMUSCULAR | Status: DC | PRN
  Administered 2024-02-28: 50 ug via INTRAVENOUS

## 2024-02-28 MED ORDER — BUPIVACAINE LIPOSOME 1.3 % IJ SUSP
INTRAMUSCULAR | Status: AC
  Filled 2024-02-28: qty 20

## 2024-02-28 MED ORDER — LACTATED RINGERS IV SOLN: INTRAVENOUS | Status: DC

## 2024-02-28 MED ORDER — ACETAMINOPHEN 500 MG PO TABS
1000.0000 mg | ORAL_TABLET | ORAL | Status: AC
  Administered 2024-02-28: 1000 mg via ORAL
  Filled 2024-02-28: qty 2

## 2024-02-28 MED ORDER — DOCUSATE SODIUM 100 MG PO CAPS
100.0000 mg | ORAL_CAPSULE | Freq: Two times a day (BID) | ORAL | 0 refills | Status: AC
  Filled 2024-02-28: qty 30, 15d supply, fill #0

## 2024-02-28 MED ORDER — SUGAMMADEX SODIUM 200 MG/2ML IV SOLN
INTRAVENOUS | Status: AC
  Filled 2024-02-28: qty 2

## 2024-02-28 MED ORDER — CHLORHEXIDINE GLUCONATE 4 % EX SOLN: 60.0000 mL | Freq: Once | CUTANEOUS | Status: DC

## 2024-02-28 MED ORDER — BUPIVACAINE-EPINEPHRINE 0.25% -1:200000 IJ SOLN
INTRAMUSCULAR | Status: DC | PRN
  Administered 2024-02-28: 30 mL

## 2024-02-28 MED ORDER — 0.9 % SODIUM CHLORIDE (POUR BTL) OPTIME
TOPICAL | Status: DC | PRN
Start: 2024-02-28 — End: 2024-02-28
  Administered 2024-02-28: 1000 mL

## 2024-02-28 MED ORDER — SUGAMMADEX SODIUM 200 MG/2ML IV SOLN
INTRAVENOUS | Status: DC | PRN
  Administered 2024-02-28: 200 mg via INTRAVENOUS

## 2024-02-28 MED ORDER — PHENYLEPHRINE 80 MCG/ML (10ML) SYRINGE FOR IV PUSH (FOR BLOOD PRESSURE SUPPORT)
PREFILLED_SYRINGE | INTRAVENOUS | Status: DC | PRN
  Administered 2024-02-28 (×2): 160 ug via INTRAVENOUS

## 2024-02-28 MED ORDER — ORAL CARE MOUTH RINSE: 15.0000 mL | Freq: Once | OROMUCOSAL | Status: AC

## 2024-02-28 MED ORDER — PHENYLEPHRINE HCL-NACL 20-0.9 MG/250ML-% IV SOLN
INTRAVENOUS | Status: DC | PRN
  Administered 2024-02-28: 20 ug/min via INTRAVENOUS

## 2024-02-28 MED ORDER — CHLORHEXIDINE GLUCONATE 0.12 % MT SOLN
15.0000 mL | Freq: Once | OROMUCOSAL | Status: AC
  Administered 2024-02-28: 15 mL via OROMUCOSAL

## 2024-02-28 SURGICAL SUPPLY — 29 items
BAG COUNTER SPONGE SURGICOUNT (BAG) IMPLANT
BENZOIN TINCTURE PRP APPL 2/3 (GAUZE/BANDAGES/DRESSINGS) ×1 IMPLANT
BLADE SURG 15 STRL LF DISP TIS (BLADE) ×1 IMPLANT
CHLORAPREP W/TINT 26 (MISCELLANEOUS) ×1 IMPLANT
COVER SURGICAL LIGHT HANDLE (MISCELLANEOUS) ×1 IMPLANT
DRAIN PENROSE 0.5X18 (DRAIN) ×1 IMPLANT
DRAPE LAPAROSCOPIC ABDOMINAL (DRAPES) ×1 IMPLANT
ELECT REM PT RETURN 15FT ADLT (MISCELLANEOUS) ×1 IMPLANT
GAUZE SPONGE 4X4 12PLY STRL (GAUZE/BANDAGES/DRESSINGS) IMPLANT
GLOVE BIO SURGEON STRL SZ 6 (GLOVE) ×1 IMPLANT
GLOVE INDICATOR 6.5 STRL GRN (GLOVE) ×1 IMPLANT
GOWN STRL REUS W/ TWL LRG LVL3 (GOWN DISPOSABLE) ×1 IMPLANT
KIT BASIN OR (CUSTOM PROCEDURE TRAY) ×1 IMPLANT
KIT TURNOVER KIT A (KITS) IMPLANT
MARKER SKIN DUAL TIP RULER LAB (MISCELLANEOUS) ×1 IMPLANT
MESH ULTRAPRO 3X6 7.6X15CM (Mesh General) IMPLANT
NDL HYPO 22X1.5 SAFETY MO (MISCELLANEOUS) ×1 IMPLANT
NEEDLE HYPO 22X1.5 SAFETY MO (MISCELLANEOUS) ×1 IMPLANT
PACK GENERAL/GYN (CUSTOM PROCEDURE TRAY) ×1 IMPLANT
SPIKE FLUID TRANSFER (MISCELLANEOUS) ×1 IMPLANT
STRIP CLOSURE SKIN 1/2X4 (GAUZE/BANDAGES/DRESSINGS) ×1 IMPLANT
SUT ETHIBOND 0 MO6 C/R (SUTURE) ×1 IMPLANT
SUT MNCRL AB 4-0 PS2 18 (SUTURE) ×1 IMPLANT
SUT PDS AB 0 CT1 36 (SUTURE) ×2 IMPLANT
SUT VIC AB 3-0 SH 27XBRD (SUTURE) ×2 IMPLANT
SUT VICRYL 3 0 BR 18 UND (SUTURE) ×1 IMPLANT
SYR CONTROL 10ML LL (SYRINGE) ×1 IMPLANT
TAPE CLOTH SURG 4X10 WHT LF (GAUZE/BANDAGES/DRESSINGS) IMPLANT
TOWEL OR 17X26 10 PK STRL BLUE (TOWEL DISPOSABLE) ×1 IMPLANT

## 2024-02-28 NOTE — Discharge Instructions (Addendum)
 HERNIA REPAIR: POST OP INSTRUCTIONS   EAT Gradually transition to a high fiber diet with a fiber supplement over the next few weeks after discharge.  Start with a pureed / full liquid diet (see below)  WALK Walk an hour a day (cumulative- not all at once).  Control your pain to do that.    CONTROL PAIN Control pain so that you can walk, sleep, tolerate sneezing/coughing, and go up/down stairs.  HAVE A BOWEL MOVEMENT DAILY Keep your bowels regular to avoid problems.  OK to try a laxative to override constipation.  OK to use an antidiarrheal to slow down diarrhea.  Call if not better after 2 tries  CALL IF YOU HAVE PROBLEMS/CONCERNS Call if you are still struggling despite following these instructions. Call if you have concerns not answered by these instructions  ######################################################################    DIET: Follow a light bland diet & liquids the first 24 hours after arrival home, such as soup, liquids, starches, etc.  Be sure to drink plenty of fluids.  Quickly advance to a usual solid diet within a few days.  Avoid fast food or heavy meals initially as you are more likely to get nauseated or have irregular bowels.  A low-sugar, high-fiber diet for the rest of your life is ideal.   Take your usually prescribed home medications unless otherwise directed.  PAIN CONTROL: Pain is best controlled by a usual combination of three different methods TOGETHER: Ice/Heat Over the counter pain medication Prescription pain medication Most patients will experience some swelling and bruising around the hernia(s) such as the bellybutton, groins, or old incisions.  Ice packs or heating pads (30-60 minutes up to 6 times a day) will help. Use ice for the first few days to help decrease swelling and bruising, then switch to heat to help relax tight/sore spots and speed recovery.  Some people prefer to use ice alone, heat alone, alternating between ice & heat.  Experiment  to what works for you.  Swelling and bruising can take several weeks to resolve.   It is helpful to take an over-the-counter pain medication regularly for the first days: Naproxen (Aleve, etc)  Two 220mg  tabs twice a day OR Ibuprofen (Advil, etc) Three 200mg  tabs four times a day (every meal & bedtime) AND Acetaminophen (Tylenol, etc) 325-650mg  four times a day (every meal & bedtime) A  prescription for pain medication should be given to you upon discharge.  Take your pain medication as prescribed, IF NEEDED.  If you are having problems/concerns with the prescription medicine (does not control pain, nausea, vomiting, rash, itching, etc), please call us [phone] to see if we need to switch you to a different pain medicine that will work better for you and/or control your side effect better. If you need a refill on your pain medication, please contact your pharmacy.  They will contact our office to request authorization. Prescriptions will not be filled after 5 pm or on week-ends.  Avoid getting constipated.  Between the surgery and the pain medications, it is common to experience some constipation.  Increasing fluid intake and taking a fiber supplement (such as Metamucil, Citrucel, FiberCon, MiraLax, etc) 1-2 times a day regularly will usually help prevent this problem from occurring.  A mild laxative (prune juice, Milk of Magnesia, MiraLax, etc) should be taken according to package directions if there are no bowel movements after 48 hours.    Wash / shower every day, starting 2 days after surgery.  You may shower over  the steri strips which are waterproof.  No rubbing, scrubbing, lotions or ointments to incision(s). Do not soak or submerge incision.   Remove your outer bandage 2 days after surgery. Steri strips (white tapes) will peel off after 1-2 weeks. You may leave the incision open to air.  You may replace a dressing/Band-Aid to cover an incision for comfort if you wish.   ACTIVITIES as  tolerated:   You may resume regular (light) daily activities beginning the next day--such as daily self-care, walking, climbing stairs--gradually increasing activities as tolerated.  Control your pain so that you can walk an hour a day.  If you can walk 30 minutes without difficulty, it is safe to try more intense activity such as jogging, treadmill, bicycling, low-impact aerobics, swimming, etc. Refrain from the most intensive and strenuous activity such as sit-ups, heavy lifting, contact sports, etc  Refrain from any heavy lifting or straining until 6 weeks after surgery.   DO NOT PUSH THROUGH PAIN.  Let pain be your guide: If it hurts to do something, don't do it.  Pain is your body warning you to avoid that activity for another week until the pain goes down. You may drive when you are no longer taking prescription pain medication, you can comfortably wear a seatbelt, and you can safely maneuver your car and apply brakes. You may have sexual intercourse when it is comfortable.   FOLLOW UP in our office Please call [facility] at [phone] to set up an appointment to see your surgeon in the office for a follow-up appointment approximately 2-3 weeks after your surgery. Make sure that you call for this appointment the day you arrive home to insure a convenient appointment time.  9.  If you have disability of FMLA / Family leave forms, please bring the forms to the office for processing.  (do not give to your surgeon).  WHEN TO CALL us [phone]: Poor pain control Reactions / problems with new medications (rash/itching, nausea, etc)  Fever over 101.5 F (38.5 C) Inability to urinate Nausea and/or vomiting Worsening swelling or bruising Continued bleeding from incision. Increased pain, redness, or drainage from the incision   The clinic staff is available to answer your questions during regular business hours (8:30am-5pm).  Please don't hesitate to call and ask to speak to one of our nurses for  clinical concerns.   If you have a medical emergency, go to the nearest emergency room or call 911.  A surgeon from [facility] is always on call at the hospitals in [facility], [place] [address], [address], [place], [place]  [address] ?  [address] [address], [place], [place]   [address] MAIN: [phone] ? TOLL FREE: [phone] ? FAX: [phone] [email or web address]

## 2024-02-28 NOTE — Anesthesia Procedure Notes (Signed)
 Procedure Name: Intubation Date/Time: 02/28/2024 7:28 AM  Performed by: Randa Evens, CRNAPre-anesthesia Checklist: Patient identified, Emergency Drugs available, Suction available and Patient being monitored Patient Re-evaluated:Patient Re-evaluated prior to induction Oxygen Delivery Method: Circle System Utilized Preoxygenation: Pre-oxygenation with 100% oxygen Induction Type: IV induction Ventilation: Mask ventilation without difficulty Laryngoscope Size: Glidescope and 4 Grade View: Grade I Tube type: Oral Number of attempts: 1 (First look with mac 4 grade 3 view) Airway Equipment and Method: Stylet and Oral airway Placement Confirmation: ETT inserted through vocal cords under direct vision, positive ETCO2 and breath sounds checked- equal and bilateral Tube secured with: Tape Dental Injury: Teeth and Oropharynx as per pre-operative assessment

## 2024-02-28 NOTE — Op Note (Signed)
 Operative Note  Mark Hall  [id number]  [id number]  02/28/2024   Surgeon: Berna Bue MD FACS   Procedure performed: Open left inguinal hernia repair with mesh   Preop diagnosis:  left inguinal hernia   Post-op diagnosis/intraop findings:  large indirect and direct left inguinal hernia   Specimens: none   EBL: 5cc   Complications: none   Description of procedure: After confirming informed consent, the patient was taken to the operating room and placed supine on operating room table where general anesthesia was initiated, preoperative antibiotics were administered, SCDs applied, and a formal timeout was performed. The groin was clipped, prepped and draped in the usual sterile fashion. An oblique incision was made the just above the inguinal ligament after infiltrating the tissues with local anesthetic (exparel mixed with 025% marcaine with epinephrine. Soft tissues were dissected using electrocautery until the external oblique aponeurosis was encountered. This was divided sharply to expand the external ring. A plane was bluntly developed between the spermatic cord and the external oblique. The spermatic cord was then bluntly dissected away from the pubic tubercle and encircled with a Penrose. Inspection of the inguinal anatomy revealed a large indirect sac as well as a direct defect with disruption of the inguinal floor. The indirect hernia sac was bluntly dissected away from the cord structures and skeletonized to the level of the internal ring, where it was reduced intact into the abdomen.  A small cord lipoma was excised with cautery. The inguinal floor was reconstructed, suturing the conjoint tendon to the inguinal ligament with interrupted 0 PDS, leaving an internal ring just sufficient for the cord structures. A 3 x 6 piece of ultra Pro mesh was brought onto the field and trimmed to approximate the field. This was sutured to the pubic tubercle fascia, inferior shelving edge and to the  internal oblique superiorly with interrupted 0 ethibonds. The tails of the mesh were wrapped around the spermatic cord, ensuring adequate room for the cord, and sutured to each other with 0 ethibond, and then directed laterally to lie flat beneath the external oblique aponeurosis. An additional suture was placed medially to reinforce the slit in the mesh. Hemostasis was ensured within the wound. The Penrose was removed. The external oblique aponeurosis was reapproximated with a running 3-0 Vicryl to re-create a narrowed external ring. More local was infiltrated around the pubic tubercle and in the plane just below the external oblique. The Scarpa's was reapproximated with interrupted 3-0 Vicryls. The skin was closed with a running subcuticular 4-0 Monocryl. The remainder of the local was injected in the subcutaneous and subcuticular space. The field was then cleaned, benzoin and Steri-Strips and sterile bandage were applied. Both testicles were palpated in the scrotum at the end of the case. The patient was then awakened extubated and taken to PACU in stable condition.    All counts were correct at the completion of the case

## 2024-02-28 NOTE — Anesthesia Postprocedure Evaluation (Signed)
 Anesthesia Post Note  Patient: Mark Hall  Procedure(s) Performed: OPEN LEFT INGUINAL HERNIA REPAIR WITH MESH (Left: Abdomen)     Patient location during evaluation: PACU Anesthesia Type: General Level of consciousness: awake Pain management: pain level controlled Vital Signs Assessment: post-procedure vital signs reviewed and stable Respiratory status: spontaneous breathing, nonlabored ventilation and respiratory function stable Cardiovascular status: blood pressure returned to baseline and stable Postop Assessment: no apparent nausea or vomiting Anesthetic complications: no   No notable events documented.  Last Vitals:  Vitals:   02/28/24 0930 02/28/24 0945  BP: 126/70 (!) 142/75  Pulse: 84 87  Resp: 16 15  Temp:    SpO2: 93% 92%    Last Pain:  Vitals:   02/28/24 0945  TempSrc:   PainSc: 0-No pain                 Linton Rump

## 2024-02-28 NOTE — Interval H&P Note (Signed)
 History and Physical Interval Note:  02/28/2024 7:06 AM  Mark Hall  has presented today for surgery, with the diagnosis of hernia.  The various methods of treatment have been discussed with the patient and family. After consideration of risks, benefits and other options for treatment, the patient has consented to  Procedure(s): OPEN LEFT INGUINAL HERNIA REPAIR WITH MESH (Left) as a surgical intervention.  The patient's history has been reviewed, patient examined, no change in status, stable for surgery.  I have reviewed the patient's chart and labs.  Questions were answered to the patient's satisfaction.     [name]

## 2024-02-28 NOTE — Transfer of Care (Signed)
 Immediate Anesthesia Transfer of Care Note  Patient: Breylon Sherrow  Procedure(s) Performed: OPEN LEFT INGUINAL HERNIA REPAIR WITH MESH (Left: Abdomen)  Patient Location: PACU  Anesthesia Type:General  Level of Consciousness: drowsy  Airway & Oxygen Therapy: Patient Spontanous Breathing  Post-op Assessment: Report given to RN  Post vital signs: Reviewed and stable  Last Vitals:  Vitals Value Taken Time  BP 123/57 02/28/24 0845  Temp    Pulse 98 02/28/24 0845  Resp 20 02/28/24 0845  SpO2 91 % 02/28/24 0845  Vitals shown include unfiled device data.  Last Pain:  Vitals:   02/28/24 0607  TempSrc: Oral  PainSc:          Complications: No notable events documented.

## 2024-03-02 ENCOUNTER — Encounter (HOSPITAL_COMMUNITY): Payer: Self-pay | Admitting: Surgery

## 2024-03-12 DIAGNOSIS — R220 Localized swelling, mass and lump, head: Secondary | ICD-10-CM | POA: Diagnosis not present

## 2024-03-12 DIAGNOSIS — T7840XA Allergy, unspecified, initial encounter: Secondary | ICD-10-CM | POA: Diagnosis not present

## 2024-03-16 DIAGNOSIS — E782 Mixed hyperlipidemia: Secondary | ICD-10-CM | POA: Diagnosis not present

## 2024-03-16 DIAGNOSIS — R7303 Prediabetes: Secondary | ICD-10-CM | POA: Diagnosis not present

## 2024-03-16 DIAGNOSIS — I10 Essential (primary) hypertension: Secondary | ICD-10-CM | POA: Diagnosis not present

## 2024-03-27 NOTE — Progress Notes (Signed)
   Mark Hall [id number]  DATE OF ENCOUNTER: 03/27/2024 Interval History:   He has been recovering fairly well.  He has ongoing soreness just inferior to the incision.  This seems to have been stagnant for the last week or 2.  Reports he is eating at baseline, bowel movements and bladder function are at baseline.  Physical Examination:   There were no vitals filed for this visit.  Alert, well-appearing Unlabored respirations Abdomen is soft, nontender.  Incision has healed for the most part however there is an approximately 2 cm decompressed blister along the lateral superior aspect of the incision, no evidence of infection at this time. Hernia repair intact.  No significant seroma or hematoma.   Assessment and Plan:   Recovering appropriately.  Anticipate that the pain will dissipate over the next few weeks.  I suspect that he had a reaction to the Steri-Strips or benzoin and advised Vaseline over the blister area and to continue to monitor this.  Discussed expectations for ongoing recovery, activity limitations if applicable, and reasons to call.  He will let us  know in about a month if pain has not improved, sooner if needed especially if there are any issues with the incision.  2/28: open repair of large left indirect + direct inguinal hernia with mesh  CHELSEA ALAN FREUND, MD

## 2024-05-18 DIAGNOSIS — T7840XA Allergy, unspecified, initial encounter: Secondary | ICD-10-CM | POA: Diagnosis not present

## 2024-05-18 DIAGNOSIS — R220 Localized swelling, mass and lump, head: Secondary | ICD-10-CM | POA: Diagnosis not present

## 2024-05-19 DIAGNOSIS — R4189 Other symptoms and signs involving cognitive functions and awareness: Secondary | ICD-10-CM | POA: Diagnosis not present

## 2024-05-19 DIAGNOSIS — I10 Essential (primary) hypertension: Secondary | ICD-10-CM | POA: Diagnosis not present

## 2024-05-27 ENCOUNTER — Other Ambulatory Visit: Payer: Self-pay | Admitting: Family Medicine

## 2024-05-27 DIAGNOSIS — R4189 Other symptoms and signs involving cognitive functions and awareness: Secondary | ICD-10-CM

## 2024-06-01 ENCOUNTER — Encounter: Payer: Self-pay | Admitting: Physician Assistant

## 2024-06-18 ENCOUNTER — Encounter: Payer: Self-pay | Admitting: Physician Assistant

## 2024-06-23 ENCOUNTER — Ambulatory Visit
Admission: RE | Admit: 2024-06-23 | Discharge: 2024-06-23 | Discharge status: Home / Self Care | Source: Ambulatory Visit | Attending: Family Medicine | Admitting: Family Medicine

## 2024-06-23 DIAGNOSIS — R42 Dizziness and giddiness: Secondary | ICD-10-CM | POA: Diagnosis not present

## 2024-06-23 DIAGNOSIS — R519 Headache, unspecified: Secondary | ICD-10-CM | POA: Diagnosis not present

## 2024-06-23 DIAGNOSIS — R413 Other amnesia: Secondary | ICD-10-CM | POA: Diagnosis not present

## 2024-06-23 DIAGNOSIS — R4189 Other symptoms and signs involving cognitive functions and awareness: Secondary | ICD-10-CM

## 2024-07-11 NOTE — Progress Notes (Addendum)
 Assessment/Plan:     Mark [name] is a very pleasant 83 y.o. year old RH male with a history of hypertension, hyperlipidemia, arthritis, prediabetes seen today for evaluation of memory loss. MoCA today is 18/30. Etiology is unclear, workup is in progress.  Patient is able to participate on ADLs. Mood is stable   Memory Impairment of unclear etiology   MRI brain without contrast to assess for underlying structural abnormality and assess vascular load  Neurocognitive testing to further evaluate cognitive concerns and determine other underlying cause of memory changes, including potential contribution from sleep, anxiety, attention, or depression among others  Check B12, TSH Recommend good control of cardiovascular risk factors.   Continue to control mood as per PCP Folllow up in 2-3 months  Monitor driving.  Subjective:    The patient is accompanied by his son who supplements  the history.   How long did patient have memory difficulties?  For about 1 year. Children noticed  the changes a few months ago, when he hell victim of a scam with [employer]  especially STM. Patient reports some difficulty remembering new information, recent conversations, names. LTM is good.  repeats oneself?  Endorsed Disoriented when walking into a room? Denies   Leaving objects in unusual places?  Denies.   Wandering behavior? Denies.   Any personality changes, or depression, anxiety? Denies   Hallucinations or paranoia? Denies.   Seizures? Denies.    Any sleep changes?  Sleeps well . Denies frequent nightmares or dream reenactment, other REM behavior or sleepwalking   Sleep apnea? Denies.   Any hygiene concerns?  Denies.   Independent of bathing and dressing? Endorsed  Does the patient need help with medications?Patient  is in charge   Who is in charge of the finances? Daughter is his FPOA.  Any changes in appetite?   I eat smaller portions, unintentionally Patient have trouble  swallowing?  Denies.   Does the patient cook? No  Any headaches?  Denies.   Chronic pain? Denies.   Ambulates with difficulty? Walking slower than before.   Recent falls or head injuries? Denies.     Vision changes?  Denies any new issues Occasional blurriness that last a second or two, 2 or 3 a year.  Any strokelike symptoms? Denies.   Any tremors? Denies.  Any anosmia? Denies.   Any incontinence of urine?  Frequency, no UTIs Any bowel dysfunction? Denies.      Patient lives alone.  History of heavy alcohol intake? Denies.   History of heavy tobacco use? Denies.   Family history of dementia?  Father with dementia ?type Does patient drive?    yes, a couple of times may have gone too far in the street. Son is concerned about him driving 35 miles to his house. His son is concerned about his driving.  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    CT of the head, personally reviewed 06/23/2024 remarkable for cerebral and cerebellar atrophy and minimal white matter microvascular ischemic changes, no acute intracranial findings  Allergies  Allergen Reactions   Sulfa Antibiotics Other (See Comments)    Unsure years ago     Current Outpatient Medications  Medication Instructions   amoxicillin (AMOXIL) 2,000 mg, See admin instructions   lisinopril (ZESTRIL) 20 mg, Every morning   Magnesium 250 mg, Daily at bedtime   Melatonin 10 mg, Daily at bedtime   simvastatin  (ZOCOR ) 10 mg, Every evening   Vitamin D3 2,000 Units,  Daily at bedtime     VITALS:   Vitals:   07/14/24 1329  BP: (!) 147/70  Pulse: 60  Resp: 20  SpO2: 97%  Weight: 173 lb (78.5 kg)     Physical Exam  :    07/14/2024    2:00 PM  Montreal Cognitive Assessment   Visuospatial/ Executive (0/5) 3  Naming (0/3) 3  Attention: Read list of digits (0/2) 2  Attention: Read list of letters (0/1) 1  Attention: Serial 7 subtraction starting at 100 (0/3) 3  Language: Repeat phrase (0/2) 2  Language :  Fluency (0/1) 0  Abstraction (0/2) 1  Delayed Recall (0/5) 0  Orientation (0/6) 3  Total 18  Adjusted Score (based on education) 18        No data to display             HEENT:  Normocephalic, atraumatic.  The superficial temporal arteries are without ropiness or tenderness. Cardiovascular: Regular rate and rhythm. Lungs: Clear to auscultation bilaterally. Neck: There are no carotid bruits noted bilaterally. Orientation:  Alert and oriented to person, place and not to time . No aphasia or dysarthria. Fund of knowledge is appropriate. Recent and remote memory impaired.  Attention and concentration are reduced .  Able to name objects and repeat phrases.   Delayed recall 0/5 Cranial nerves: There is good facial symmetry. Extraocular muscles are intact and visual fields are full to confrontational testing. Speech is fluent and clear. No tongue deviation. Hearing is intact to conversational tone.  Tone: Tone is good throughout. Sensation: Sensation is intact to light touch.  Vibration is intact at the bilateral big toe.  Coordination: The patient has no difficulty with RAM's or FNF bilaterally. Normal finger to nose  Motor: Strength is 5/5 in the bilateral upper and lower extremities. There is no pronator drift. There are no fasciculations noted. DTR's: Deep tendon reflexes are 2/4 bilaterally. Gait and Station: The patient is able to ambulate without difficulty. Gait is cautious and narrow. Stride length is normal.        Thank you for allowing us  the opportunity to participate in the care of this nice patient. Please do not hesitate to contact us  for any questions or concerns.   Total time spent on today's visit was 60 minutes dedicated to this patient today, preparing to see patient, examining the patient, ordering tests and/or medications and counseling the patient, documenting clinical information in the EHR or other health record, independently interpreting results and communicating  results to the patient/family, discussing treatment and goals, answering patient's questions and coordinating care.  Cc:  Marvene [name], [name]  Camie Sevin 07/14/2024 2:46 PM

## 2024-07-14 ENCOUNTER — Ambulatory Visit: Admitting: Physician Assistant

## 2024-07-14 ENCOUNTER — Encounter

## 2024-07-14 ENCOUNTER — Other Ambulatory Visit

## 2024-07-14 ENCOUNTER — Encounter: Payer: Self-pay | Admitting: Physician Assistant

## 2024-07-14 VITALS — BP 147/70 | HR 60 | Resp 20 | Wt 173.0 lb

## 2024-07-14 DIAGNOSIS — R413 Other amnesia: Secondary | ICD-10-CM | POA: Diagnosis not present

## 2024-07-14 DIAGNOSIS — G3184 Mild cognitive impairment, so stated: Secondary | ICD-10-CM | POA: Insufficient documentation

## 2024-07-14 LAB — VITAMIN B12: Vitamin B-12: 353 pg/mL (ref 200–1100)

## 2024-07-14 LAB — TSH: TSH: 4.62 m[IU]/L — ABNORMAL HIGH (ref 0.40–4.50)

## 2024-07-14 NOTE — Patient Instructions (Signed)
 It was a pleasure to see you today at our office.   Recommendations:  Neurocognitive evaluation at our office   MRI of the brain, the radiology office will call you to arrange you appointment   Check labs today   Follow up in  months Recommend visiting the website :  Dementia [facility] to better understand some behaviors related to memory loss.  For psychiatric meds, mood meds: Please have your primary care physician manage these medications.  If you have any severe symptoms of a stroke, or other severe issues such as confusion,severe chills or fever, etc call 911 or go to the ER as you may need to be evaluated further For guidance regarding [facility] and if placement were needed at the facility, contact Social Worker tel: [phone]  For assessment of decision of mental capacity and competency:  Call Dr. Rosaline Nine, geriatric psychiatrist at [phone] Counseling regarding caregiver distress, including caregiver depression, anxiety and issues regarding community resources, adult day care programs, adult living facilities, or memory care questions:  please contact your  Primary Doctor's Social Worker   FOR Memory  decline, memory medications: Call our office [phone]    [email or web address]   RECOMMENDATIONS FOR ALL PATIENTS WITH MEMORY PROBLEMS: 1. Continue to exercise (Recommend 30 minutes of walking everyday, or 3 hours every week) 2. Increase social interactions - continue going to [place] and enjoy social gatherings with friends and family 3. Eat healthy, avoid fried foods and eat more fruits and vegetables 4. Maintain adequate blood pressure, blood sugar, and blood cholesterol level. Reducing the risk of stroke and cardiovascular disease also helps promoting better memory. 5. Avoid stressful situations. Live a simple life and avoid aggravations. Organize your time and prepare for the next day in  anticipation. 6. Sleep well, avoid any interruptions of sleep and avoid any distractions in the bedroom that may interfere with adequate sleep quality 7. Avoid sugar, avoid sweets as there is a strong link between excessive sugar intake, diabetes, and cognitive impairment We discussed the Mediterranean diet, which has been shown to help patients reduce the risk of progressive memory disorders and reduces cardiovascular risk. This includes eating fish, eat fruits and green leafy vegetables, nuts like almonds and hazelnuts, walnuts, and also use olive oil. Avoid fast foods and fried foods as much as possible. Avoid sweets and sugar as sugar use has been linked to worsening of memory function.  There is always a concern of gradual progression of memory problems. If this is the case, then we may need to adjust level of care according to patient needs. Support, both to the patient and caregiver, should then be put into place.      You have been referred for a neuropsychological evaluation (i.e., evaluation of memory and thinking abilities). Please bring someone with you to this appointment if possible, as it is helpful for the doctor to hear from both you and another adult who knows you well. Please bring eyeglasses and hearing aids if you wear them.    The evaluation will take approximately 3 hours and has two parts:   The first part is a clinical interview with the neuropsychologist (Dr. Richie or Dr. Gayland). During the interview, the neuropsychologist will speak with you and the individual you brought to the appointment.    The second part of the evaluation is testing with the doctor's technician Neal or Luke). During the testing, the technician will ask you to remember different types of material, solve problems,  and answer some questionnaires. Your family member will not be present for this portion of the evaluation.   Please note: We must reserve several hours of the neuropsychologist's time and  the psychometrician's time for your evaluation appointment. As such, there is a No-Show fee of $100. If you are unable to attend any of your appointments, please contact our office as soon as possible to reschedule.      DRIVING: Regarding driving, in patients with progressive memory problems, driving will be impaired. We advise to have someone else do the driving if trouble finding directions or if minor accidents are reported. Independent driving assessment is available to determine safety of driving.   If you are interested in the driving assessment, you can contact the following:  [facility] in [place] [phone]  [facility] [phone]  [facility] [phone]  [facility] [phone] or [phone]   FALL PRECAUTIONS: Be cautious when walking. Scan the area for obstacles that may increase the risk of trips and falls. When getting up in the mornings, sit up at the edge of the bed for a few minutes before getting out of bed. Consider elevating the bed at the head end to avoid drop of blood pressure when getting up. Walk always in a well-lit room (use night lights in the walls). Avoid area rugs or power cords from appliances in the middle of the walkways. Use a walker or a cane if necessary and consider physical therapy for balance exercise. Get your eyesight checked regularly.  FINANCIAL OVERSIGHT: Supervision, especially oversight when making financial decisions or transactions is also recommended.  HOME SAFETY: Consider the safety of the kitchen when operating appliances like stoves, microwave oven, and blender. Consider having supervision and share cooking responsibilities until no longer able to participate in those. Accidents with firearms and other hazards in the house should be identified and addressed as well.   ABILITY TO BE LEFT ALONE: If patient is unable to contact 911 operator, consider using LifeLine, or when the  need is there, arrange for someone to stay with patients. Smoking is a fire hazard, consider supervision or cessation. Risk of wandering should be assessed by caregiver and if detected at any point, supervision and safe proof recommendations should be instituted.  MEDICATION SUPERVISION: Inability to self-administer medication needs to be constantly addressed. Implement a mechanism to ensure safe administration of the medications.      Mediterranean Diet A Mediterranean diet refers to food and lifestyle choices that are based on the traditions of countries located on the [employer] [place]. This way of eating has been shown to help prevent certain conditions and improve outcomes for people who have chronic diseases, like kidney disease and heart disease. What are tips for following this plan? Lifestyle  Cook and eat meals together with your family, when possible. Drink enough fluid to keep your urine clear or pale yellow. Be physically active every day. This includes: Aerobic exercise like running or swimming. Leisure activities like gardening, walking, or housework. Get 7-8 hours of sleep each night. If recommended by your health care provider, drink red wine in moderation. This means 1 glass a day for nonpregnant women and 2 glasses a day for men. A glass of wine equals 5 oz (150 mL). Reading food labels  Check the serving size of packaged foods. For foods such as rice and pasta, the serving size refers to the amount of cooked product, not dry. Check the total fat in packaged foods. Avoid foods  that have saturated fat or trans fats. Check the ingredients list for added sugars, such as corn syrup. Shopping  At the grocery store, buy most of your food from the areas near the walls of the store. This includes: Fresh fruits and vegetables (produce). Grains, beans, nuts, and seeds. Some of these may be available in unpackaged forms or large amounts (in bulk). Fresh seafood. Poultry and  eggs. Low-fat dairy products. Buy whole ingredients instead of prepackaged foods. Buy fresh fruits and vegetables in-season from local farmers markets. Buy frozen fruits and vegetables in resealable bags. If you do not have access to quality fresh seafood, buy precooked frozen shrimp or canned fish, such as tuna, salmon, or sardines. Buy small amounts of raw or cooked vegetables, salads, or olives from the deli or salad bar at your store. Stock your pantry so you always have certain foods on hand, such as olive oil, canned tuna, canned tomatoes, rice, pasta, and beans. Cooking  Cook foods with extra-virgin olive oil instead of using butter or other vegetable oils. Have meat as a side dish, and have vegetables or grains as your main dish. This means having meat in small portions or adding small amounts of meat to foods like pasta or stew. Use beans or vegetables instead of meat in common dishes like chili or lasagna. Experiment with different cooking methods. Try roasting or broiling vegetables instead of steaming or sauteing them. Add frozen vegetables to soups, stews, pasta, or rice. Add nuts or seeds for added healthy fat at each meal. You can add these to yogurt, salads, or vegetable dishes. Marinate fish or vegetables using olive oil, lemon juice, garlic, and fresh herbs. Meal planning  Plan to eat 1 vegetarian meal one day each week. Try to work up to 2 vegetarian meals, if possible. Eat seafood 2 or more times a week. Have healthy snacks readily available, such as: Vegetable sticks with hummus. Greek yogurt. Fruit and nut trail mix. Eat balanced meals throughout the week. This includes: Fruit: 2-3 servings a day Vegetables: 4-5 servings a day Low-fat dairy: 2 servings a day Fish, poultry, or lean meat: 1 serving a day Beans and legumes: 2 or more servings a week Nuts and seeds: 1-2 servings a day Whole grains: 6-8 servings a day Extra-virgin olive oil: 3-4 servings a day Limit  red meat and sweets to only a few servings a month What are my food choices? Mediterranean diet Recommended Grains: Whole-grain pasta. Brown rice. Bulgar wheat. Polenta. Couscous. Whole-wheat bread. [place] Made[place]. Vegetables: Artichokes. Beets. Broccoli. Cabbage. Carrots. Eggplant. Green beans. Chard. Kale. Spinach. Onions. Leeks. Peas. Squash. Tomatoes. Peppers. Radishes. Fruits: Apples. Apricots. Avocado. Berries. Bananas. Cherries. Dates. Figs. Grapes. Lemons. Melon. Oranges. Peaches. Plums. Pomegranate. Meats and other protein foods: Beans. Almonds. Sunflower seeds. Pine nuts. Peanuts. Cod. Salmon. Scallops. Shrimp. Tuna. Tilapia. Clams. Oysters. Eggs. Dairy: Low-fat milk. Cheese. Greek yogurt. Beverages: Water. Red wine. Herbal tea. Fats and oils: Extra virgin olive oil. Avocado oil. Grape seed oil. Sweets and desserts: [place] yogurt with honey. Baked apples. Poached pears. Trail mix. Seasoning and other foods: Basil. Cilantro. Coriander. Cumin. Mint. Parsley. Sage. Rosemary. Tarragon. Garlic. Oregano. Thyme. Pepper. Balsalmic vinegar. Tahini. Hummus. Tomato sauce. Olives. Mushrooms. Limit these Grains: Prepackaged pasta or rice dishes. Prepackaged cereal with added sugar. Vegetables: Deep fried potatoes (french fries). Fruits: Fruit canned in syrup. Meats and other protein foods: Beef. Pork. Lamb. Poultry with skin. Hot dogs. [place]. Dairy: Ice cream. Sour cream. Whole milk. Beverages: Juice. Sugar-sweetened soft drinks.  Beer. Liquor and spirits. Fats and oils: Butter. Canola oil. Vegetable oil. Beef fat (tallow). Lard. Sweets and desserts: Cookies. Cakes. Pies. Candy. Seasoning and other foods: Mayonnaise. Premade sauces and marinades. The items listed may not be a complete list. Talk with your dietitian about what dietary choices are right for you. Summary The Mediterranean diet includes both food and lifestyle choices. Eat a variety of fresh fruits and vegetables, beans, nuts,  seeds, and whole grains. Limit the amount of red meat and sweets that you eat. Talk with your health care provider about whether it is safe for you to drink red wine in moderation. This means 1 glass a day for nonpregnant women and 2 glasses a day for men. A glass of wine equals 5 oz (150 mL). This information is not intended to replace advice given to you by your health care provider. Make sure you discuss any questions you have with your health care provider. Document Released: 08/09/2016 Document Revised: 09/11/2016 Document Reviewed: 08/09/2016 Elsevier Interactive Patient Education  2017 [employer].

## 2024-07-15 ENCOUNTER — Encounter: Payer: Self-pay | Admitting: Physician Assistant

## 2024-07-15 ENCOUNTER — Ambulatory Visit: Payer: Self-pay | Admitting: Physician Assistant

## 2024-07-16 DIAGNOSIS — D485 Neoplasm of uncertain behavior of skin: Secondary | ICD-10-CM | POA: Diagnosis not present

## 2024-07-16 DIAGNOSIS — L578 Other skin changes due to chronic exposure to nonionizing radiation: Secondary | ICD-10-CM | POA: Diagnosis not present

## 2024-07-16 DIAGNOSIS — Z85828 Personal history of other malignant neoplasm of skin: Secondary | ICD-10-CM | POA: Diagnosis not present

## 2024-07-16 DIAGNOSIS — L570 Actinic keratosis: Secondary | ICD-10-CM | POA: Diagnosis not present

## 2024-07-16 DIAGNOSIS — X32XXXS Exposure to sunlight, sequela: Secondary | ICD-10-CM | POA: Diagnosis not present

## 2024-07-16 DIAGNOSIS — Z08 Encounter for follow-up examination after completed treatment for malignant neoplasm: Secondary | ICD-10-CM | POA: Diagnosis not present

## 2024-07-16 DIAGNOSIS — L821 Other seborrheic keratosis: Secondary | ICD-10-CM | POA: Diagnosis not present

## 2024-07-16 DIAGNOSIS — L814 Other melanin hyperpigmentation: Secondary | ICD-10-CM | POA: Diagnosis not present

## 2024-07-16 DIAGNOSIS — C44719 Basal cell carcinoma of skin of left lower limb, including hip: Secondary | ICD-10-CM | POA: Diagnosis not present

## 2024-07-16 DIAGNOSIS — Z86008 Personal history of in-situ neoplasm of other site: Secondary | ICD-10-CM | POA: Diagnosis not present

## 2024-07-31 DIAGNOSIS — T7840XA Allergy, unspecified, initial encounter: Secondary | ICD-10-CM | POA: Diagnosis not present

## 2024-07-31 DIAGNOSIS — R220 Localized swelling, mass and lump, head: Secondary | ICD-10-CM | POA: Diagnosis not present

## 2024-08-01 ENCOUNTER — Ambulatory Visit
Admission: RE | Admit: 2024-08-01 | Discharge: 2024-08-01 | Disposition: A | Discharge status: Home / Self Care | Source: Ambulatory Visit | Attending: Physician Assistant | Admitting: Physician Assistant

## 2024-08-01 DIAGNOSIS — H748X2 Other specified disorders of left middle ear and mastoid: Secondary | ICD-10-CM | POA: Diagnosis not present

## 2024-08-03 ENCOUNTER — Encounter

## 2024-08-03 ENCOUNTER — Encounter: Payer: Self-pay | Admitting: Physician Assistant

## 2024-08-06 DIAGNOSIS — C44719 Basal cell carcinoma of skin of left lower limb, including hip: Secondary | ICD-10-CM | POA: Diagnosis not present

## 2024-09-14 ENCOUNTER — Ambulatory Visit: Payer: Self-pay | Admitting: Psychology

## 2024-09-14 ENCOUNTER — Ambulatory Visit: Admitting: Psychology

## 2024-09-14 DIAGNOSIS — G3184 Mild cognitive impairment, so stated: Secondary | ICD-10-CM | POA: Diagnosis not present

## 2024-09-14 DIAGNOSIS — R4189 Other symptoms and signs involving cognitive functions and awareness: Secondary | ICD-10-CM

## 2024-09-14 NOTE — Progress Notes (Signed)
 NEUROPSYCHOLOGICAL EVALUATION [place]. [facility]  [place] Department of Neurology  Date of Evaluation: 09/14/2024  REASON FOR REFERRAL   Mark Hall is an 83 year old, right-handed, White male with 16 years of formal education. He was referred for neuropsychological evaluation by Camie Sevin, PA-C, to assess current neurocognitive functioning, document potential cognitive deficits, and assist with treatment planning. This is his first neuropsychological evaluation.  SUMMARY OF RESULTS   Premorbid cognitive abilities are estimated to be in the average range based on word reading and sociodemographic factors. Relative to this baseline estimate, performance today was largely intact, except for weaknesses in certain aspects of executive functioning and learning/memory.  Specifically, performance was low on tasks of alternating attention, phonemic fluency, and semantic fluency, while other executive functioning and language measures were intact. Regarding learning/memory, he demonstrated low immediate recall of a word list, which appeared to affect his delayed recall, although recognition remained intact. He also demonstrated low immediate and delayed recall of short stories; while his recognition was below expectations, it was relatively stronger than his spontaneous recall, suggesting some benefit from cues. Encoding and recognition of shapes remained intact, yet he had trouble recalling the shapes spontaneously after a delay.  Remaining measures of attention/working memory, processing speed, and visuospatial abilities were intact.  On self-report questionnaires, he did not report significant symptoms of depression or anxiety.  DIAGNOSTIC IMPRESSION   Results of the current evaluation indicated weaknesses in aspects of executive functioning and learning/memory (i.e., primarily in encoding and retrieval rather than storage). In the setting of largely preserved functional  independence, findings support a diagnosis of mild cognitive impairment. Etiology is most likely related to cerebrovascular disease, though a mixed picture--including a neurodegenerative process such as Alzheimer's disease--cannot be ruled out given observed memory weakness. Additional contributing factors may include grief and aging. Ongoing monitoring is recommended to clarify the underlying cause of cognitive changes and to guide appropriate treatment strategies.  ICD-10 Codes: G31.84 Mild cognitive impairment  RECOMMENDATIONS   A repeat neuropsychological evaluation in 12-18 months (or sooner if functional decline is noted) is recommended.  Discuss with Ms. Wertman the potential risks and benefits of starting a medication that may help slow memory decline.  Since no one resides in the home who can casually oversee that instrumental activities of daily living are being completed appropriately, the patient is encouraged to use available tools and strategies--such as medication organizers, pillboxes, automated reminders, and autopay services--to support the management of medications, finances, and medical appointments and to minimize the risk of errors.  Performance across neurocognitive testing is not a strong predictor of an individual's safety operating a motor vehicle. Should his family wish to pursue a formalized driving evaluation, they could reach out to the following agencies:  [employer] [facility] in [place]: [phone] [facility]: [phone] [facility]: [phone] [facility]: [phone] or [phone]  Managing grief is important for overall well-being. You may find some of the following recommendations helpful:  Allow yourself to grieve: It is important to give yourself permission to feel the emotions that come with grief. Everyone grieves differently, and there is no set timeline for when you "should" feel better. Allow yourself  to experience sadness, anger, or even relief as part of the healing process. Seek support: Grief can feel isolating, but talking with someone you trust, whether it is a friend, family member, or counselor, can help you process your emotions. You might also consider joining a support group, where you can connect with  others who understand what you are going through. Practice self-care: Take care of your physical health during this time. Regular exercise, a balanced diet, and adequate sleep can help your body manage the stress of grief. It is also important to do things that make you feel good, whether it is reading, taking walks, or pursuing a hobby you enjoy. Find ways to honor the memory of your loved one: Finding a way to celebrate the life of your loved one, such as creating a memory book or doing something they loved, can help keep their memory alive and bring you comfort. Focus on the present: While remembering your loved one is important, it can also help to stay present and focus on small moments of joy in your daily life. Engage in activities that bring you a sense of calm or happiness, even if it is just for a short time. Consider mindfulness or relaxation techniques: Practices such as mindfulness meditation, yoga, or deep breathing exercises can help you manage overwhelming emotions and find moments of peace in your day. Know that healing takes time: Grief is not something you "get over" - it is something you learn to live with. Over time, the pain may lessen, and you will find ways to adjust to life without your loved one, but it is important to remember that healing is not linear.  Aim to regularly participate in activities that you find enjoyable and fulfilling, whether that be hobbies, socializing with loved ones, or being outdoors. This can improve mood, increase motivation, and offer cognitive stimulation.   Continue managing vascular risk factors through a heart healthy diet (e.g., MIND,  Mediterranean),physician-approved physical activity, and medication adherence.   Consider implementing compensatory strategies to maximize independence and maintain daily functioning. Examples include:  Adhere to routine. Compensatory strategies work best when they are used consistently. Use a planner, calendar, or white board that has the schedule and important events for the day clearly listed to reference and cross off when tasks are complete.  Ask for written information, especially if it is new or unfamiliar (e.g., information provided at a doctor's appointment).  Create an organized environment. Keep items that can be easily misplaced in a sensible location and get into the habit of always returning the items to those places. Pay attention and reduce distractions. Make a point of focusing attention on information you want to remember. One-on-one interaction is more likely to facilitate attention and minimize distraction. Make eye contact and repeat the information out loud after you hear it. Reduce interruptions or distractions especially when attempting to learn new information.  Create associations. When learning something new, think about and understand the information. Explain it in your own words or try to associate it with something you already know. Take notes to help remember important details. Evaluate goals and plan accordingly. When confronted by many different tasks, begin by making a list that prioritizes each task and estimates the time it will take to complete. Break down complicated tasks into smaller, more manageable steps. Focus on one task at a time and complete each task before starting another. Avoid multitasking.  DISPOSITION   Patient will follow up with the referring provider, Ms. Wertman. Patient should return for repeat neuropsychological testing in 12-18 months to monitor his course and assist with diagnosis and treatment planning. He and his family will be provided  verbal feedback in approximately one week regarding the findings and impression during this visit.  The remainder of the report includes the details of  the patient's background and a table of results from the current evaluation, which support the summary and recommendations described above.  BACKGROUND   History of Presenting Illness: The following information was obtained from a review of medical records and an interview with the patient and his daughter, Landry. Briefly, the patient established care with Camie Sevin, PA-C, at [facility] on 07/14/2024 due to cognitive concerns over the past year. MoCA = 18/30. He was referred for neuropsychological evaluation accordingly.  Cognitive Functioning: During today's appointment, the patient denied major cognitive concerns. In fact, he reported noticing some improvement in his cognition and feels more alert compared to several months ago. His daughter generally agreed with his assessment. She acknowledged that over the past year, there had been noticeable changes, which may have been influenced by the fact that the patient was widowed in November. At the time, he was experiencing a period of forgetfulness and reduced decision-making. However, she feels that there has been meaningful improvement over the past six months. Contributing to this improvement may be several recent positive life changes, including a vacation, getting a puppy, and having family visits. While his daughter noted some continued repetitiveness in conversation, she reported it is less frequent than before. She denied significant concerns with memory for conversations or events. She also denied observing issues with attention, processing speed, word finding, or executive functioning. Regarding the latter, she commented that the patient's wife previously managed most planning and organizing tasks, so that domain has never been his strengths. She has been doing most of the driving during her  visit and is unsure if he has driving or navigation difficulties when alone.  Physical Functioning: Patient denied difficulties with sleep initiation and maintenance. Appetite is stable. No changes to sense of taste or smell were reported. Vision and hearing are stable. He denied balance problems, falls, and tremors.  Emotional Functioning: Patient reported that his mood has actually been pretty good, attributing this improvement to having family visiting and getting a new puppy. He acknowledged that he may have experienced more issues before but feels he is doing better now. He denied any suicidal ideation. He enjoys walking daily and attending church. He is also building a model boat.  Neuroimaging: MRI of the brain (08/01/2024) documented mild generalized atrophy, mild white matter disease, and dilated perivascular spaces within the basal ganglia.  Other Relevant Medical History: Remarkable for hypertension, hyperlipidemia, prediabetes, and osteoarthritis. Please refer to the medical record for a more comprehensive problem list. No history of stroke, CNS infection, head injury, or seizure was reported.  Current Medications: Per record, magnesium, melatonin, simvastatin , and vitamin D3. Patient reported that he is not taking lisinopril any longer.  Functional Status: Patient independently performs all basic activities of daily living. He continues to drive without reported accidents, traffic violations, and navigation difficulties. Most of his bills are set to autopay; however, he manually pays his credit card and denies any issues with this. His daughter, Randine, is his financial power of attorney. He manages his medications independently without reported errors. Although he does not cook much, he stated that he is able to use the stove and microwave if needed. There was a previous incident in which he demonstrated some vulnerability to a scam, but his family was able to intervene before any  significant consequences occurred. His daughter reports that there have been no further incidents, and she believes the risk is now reduced, as he is no longer experiencing the same level of cognitive fog present  at the time of the event.  Family Neurological History: Patient suspects his father likely had dementia, although he was never formally diagnosed.  Psychiatric History: History of depression, anxiety, prior mental health treatment, suicidal ideation, hallucinations, and psychiatric hospitalizations was not reported.  Substance Use History: Patient reported infrequent alcohol consumption. Current use of nicotine, marijuana, and illicit substances was denied.  Social and Developmental History: Patient was born in [place], [place]. History of perinatal complications and developmental delays was not reported. He is widowed and has three children. He lives alone.   Educational and Occupational History: No history of childhood learning disability, special education services, or grade retention was reported. 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  BEHAVIORAL OBSERVATIONS   Patient arrived late and was accompanied by his daughter, Landry. He ambulated independently and without gait disturbance. He was alert and oriented with the exception of place (i.e., He correctly identified the city but not the hospital/building). He was appropriately groomed and dressed for the setting. No significant sensory or motor abnormalities were observed. Vision and hearing were adequate for testing purposes. Speech was of normal rate, prosody, and volume. No conversational word-finding difficulties, paraphasic errors, or dysarthria were observed. Comprehension was conversationally intact. Thought processes were linear, logical, and coherent. Thought content was organized and devoid of delusions. Insight  appeared appropriate. Affect was even and congruent with mood. He was cooperative and appeared to give adequate effort during testing. One embedded performance validity measure was slightly low on a memory task, but given his clinical presentation and overall scores, this likely does not indicate suboptimal engagement. Thus, although some caution is advised, results are considered an accurate reflection of his current cognitive functioning.  NEUROPSYCHOLOGICAL TESTING RESULTS   Tests Administered: Animal Naming Test; Brief Visuospatial Memory Test-Revised (BVMT-R) - Form 1; [place]  Verbal Learning Test Third Edition (CVLT3) - Brief Form; Controlled Oral Word Association Test (COWAT): FAS; Geriatric Anxiety Scale-10 Item (GAS-10); Geriatric Depression Scale Short Form (GDS-SF); Neuropsychological Assessment Battery (NAB) Form 1 - Subtest(s): Naming, Judgement; Repeatable Battery for the Assessment of Neuropsychological Status Update (RBANS Update) Form A - Subtest(s): Line Orientation; Test of Premorbid Functioning (TOPF); Trail Making Test (TMT); Wechsler Adult Intelligence Scale Fifth Edition (WAIS-5) - Subtest(s): Similarities, Clinical cytogeneticist, Matrix Reasoning, Digits Forward, Digit Sequencing, Coding, Symbol Search, Digits Backward; and Wechsler Memory Scale Fourth Edition (WMS-IV) - Subtest(s): Logical Memory (LM).  Test results are provided in the table below. Whenever possible, the patient's scores were compared against age-, sex-, and education-corrected normative samples. Interpretive descriptions are based on the AACN consensus conference statement on uniform labeling (Guilmette et al., 2020).  PREMORBID FUNCTIONING RAW  RANGE  TOPF 32 StdS=95 Average  ATTENTION & WORKING MEMORY RAW  RANGE  WAIS-5 Digits Forward -- ss=10 Average  WAIS-5 Digits Backward -- ss=12 High Average  WAIS-5 Digit Sequencing -- ss=6 Low Average  PROCESSING SPEED RAW  RANGE  Trails A 43''0e T=46 Average  WAIS-5 Coding   -- ss=9 Average  WAIS-5 Symbol Search -- ss=8 Average  EXECUTIVE FUNCTION RAW  RANGE  Trails B 146''4e T=43 Average  WAIS-5 Matrix Reasoning -- ss=11 Average  WAIS-5 Similarities -- ss=10 Average  COWAT Letter Fluency 8+5+5 T=32 Below Average  NAB Judgement -- T=54 Average  LANGUAGE RAW  RANGE  COWAT Letter Fluency 8+5+5 T=32 Below Average  Animal Naming Test 9 T=29 Exceptionally Low  NAB  Naming Test 27/31 T=38 WNL  VISUOSPATIAL RAW  RANGE  RBANS Line Orientation -- >75%ile High Average to Exceptionally High  WAIS-5 Block Design -- ss=12 High Average  BVMT-R Copy Trial 12/12 -- WNL  VERBAL LEARNING & MEMORY RAW  RANGE  CVLT3 Total 1-4 1,3,6,6 StdS=73 Below Average  CVLT3 SDFR  2/9 ss=1 Exceptionally Low  CVLT3 LDFR  0/9 ss=1 Exceptionally Low  CVLT3 LDCR  1/9 ss=1 Exceptionally Low  CVLT3 Recognition Hits 9 ss=13 High Average  CVLT3 Recognition False+ 0 ss=13 High Average  CVLT3 Discriminability -- ss=16 Exceptionally High  CVLT3 Intrusions 1 ss=10 Average  CVLT3 Repetitions 0 ss=12 High Average  CVLT3 Forced Choice 8/9 -- BNL  WMS-IV LM-I  (0+4+1)/53 ss=2 Exceptionally Low  WMS-IV LM-II  (0+0)/39 ss=1 Exceptionally Low  WMS-IV LM Recognition  (4+8)/23 3-9%ile Below Average  VISUAL LEARNING & MEMORY RAW  RANGE  BVMT-R Trial 1 2/12 T=40 Low Average  BVMT-R Trial 2 4/12 T=43 Average  BVMT-R Trial 3 4/12 T=38 Low Average  BVMT-R Total Recall 10/36 T=39 Low Average  BVMT-R Delayed Recall 1/12 T=30 Below Average  BVMT-R Recognition Hits 6 -- --  BVMT-R Recognition False Alarms 1 -- --  BVMT-R Recognition Discrimination Index 5 T=48 Average  *From Powell et al. (2022) -- -- --  QUESTIONNAIRES RAW  RANGE  GDS-SF 2 -- Minimal  GAS-10 1 -- Minimal  *Note: ss = scaled score; StdS = standard score; T = t-score; C/S = corrected raw score; WNL = within normal limits; BNL= below normal limits; D/C = discontinued. Scores from skewed distributions are typically interpreted as WNL (>=16th  %ile) or BNL (<16th %ile).   INFORMED CONSENT   Patient was provided with a verbal description of the nature and purpose of the neuropsychological evaluation. Also reviewed were the foreseeable risks and/or discomforts and benefits of the procedure, limits of confidentiality, and mandatory reporting requirements of this provider. Patient was given the opportunity to have their questions answered. Oral consent to participate was provided by the patient.   This report was prepared as part of a clinical evaluation and is not intended for forensic use.  SERVICE   This evaluation was conducted by Renda Beckwith, Psy.D. In addition to time spent directly with the patient, total professional time (120 minutes) includes record review, integration of relevant medical history, test selection, interpretation of findings, and report preparation. A technician, Lonell Jude, B.S., provided testing and scoring assistance (105 minutes).  Psychiatric Diagnostic Evaluation Services (Professional): [id number] x 1 Neuropsychological Testing Evaluation Services (Professional): 03867 x 1 Neuropsychological Testing Evaluation Services (Professional): 03866 x 1 Neuropsychological Test Administration and Scoring Radiographer, therapeutic): [phone] x 1 Neuropsychological Test Administration and Scoring (Technician): [phone] x 2  This report was generated using voice recognition software. While this document has been carefully reviewed, transcription errors may be present. I apologize in advance for any inconvenience. Please contact me if further clarification is needed.            Renda Beckwith, Psy.D.             Neuropsychologist

## 2024-09-14 NOTE — Progress Notes (Signed)
   Psychometrician Note   Cognitive testing was administered to Mark Hall by Lonell Jude, B.S. (psychometrist) under the supervision of Dr. Renda Beckwith, Psy.D., licensed psychologist on 09/14/2024. Mark Hall did not appear overtly distressed by the testing session per behavioral observation or responses across self-report questionnaires. Rest breaks were offered.    The battery of tests administered was selected by Dr. Renda Beckwith, Psy.D. with consideration to Mark Hall current level of functioning, the nature of his symptoms, emotional and behavioral responses during interview, level of literacy, observed level of motivation/effort, and the nature of the referral question. This battery was communicated to the psychometrist. Communication between Dr. Renda Beckwith, Psy.D. and the psychometrist was ongoing throughout the evaluation and Dr. Renda Beckwith, Psy.D. was immediately accessible at all times. Dr. Renda Beckwith, Psy.D. provided supervision to the psychometrist on the date of this service to the extent necessary to assure the quality of all services provided.    Mark Hall will return within approximately 1-2 weeks for an interactive feedback session with Dr. Beckwith at which time his test performances, clinical impressions, and treatment recommendations will be reviewed in detail. Mark Hall understands he can contact our office should he require our assistance before this time.  A total of 105 minutes of billable time were spent face-to-face with Mark Hall by the psychometrist. This includes both test administration and scoring time. Billing for these services is reflected in the clinical report generated by Dr. Renda Beckwith, Psy.D.  This note reflects time spent with the psychometrician and does not include test scores or any clinical interpretations made by Dr. Beckwith. The full report will follow in a separate note.

## 2024-10-01 ENCOUNTER — Ambulatory Visit: Admitting: Psychology

## 2024-10-01 DIAGNOSIS — G3184 Mild cognitive impairment, so stated: Secondary | ICD-10-CM

## 2024-10-01 NOTE — Progress Notes (Signed)
   NEUROPSYCHOLOGY FEEDBACK SESSION [name]. [facility]  [place] Department of Neurology  Date of Feedback Session: 10/01/2024  REASON FOR REFERRAL   Mark Hall is an 83 year old, right-handed, White male with 16 years of formal education. He was referred for neuropsychological evaluation by Camie Sevin, PA-C, to assess current neurocognitive functioning, document potential cognitive deficits, and assist with treatment planning. This is his first neuropsychological evaluation.  FEEDBACK   Patient completed a comprehensive neuropsychological evaluation on 09/14/2024. Please refer to that encounter for the full report and recommendations. Briefly, results indicated weaknesses in aspects of executive functioning and learning/memory (i.e., primarily in encoding and retrieval rather than storage). In the setting of largely preserved functional independence, findings support a diagnosis of mild cognitive impairment. Etiology is most likely related to cerebrovascular disease, though a mixed picture--including a neurodegenerative process such as Alzheimer's disease--cannot be ruled out given observed memory weakness. Additional contributing factors may include grief and aging.   Today, the patient was accompanied by his daughter. They were provided verbal feedback regarding the findings and impression during this visit, and their questions were answered. A copy of the report was provided at the conclusion of the visit.  DISPOSITION   Patient will follow up with the referring provider, Ms. Wertman. He should return for repeat neuropsychological testing in 12-18 months to monitor his course and assist with diagnosis and treatment planning.  SERVICE   This feedback session was conducted by Renda Beckwith, Psy.D. One unit of 03867 (35 minutes) was billed for Dr. Beckwith' time spent in preparing, conducting, and documenting the current feedback session.  This report was generated using  voice recognition software. While this document has been carefully reviewed, transcription errors may be present. I apologize in advance for any inconvenience. Please contact me if further clarification is needed.

## 2024-10-05 ENCOUNTER — Encounter: Payer: Self-pay | Admitting: Physician Assistant

## 2024-10-06 DIAGNOSIS — Z23 Encounter for immunization: Secondary | ICD-10-CM | POA: Diagnosis not present

## 2024-10-06 DIAGNOSIS — Z6825 Body mass index (BMI) 25.0-25.9, adult: Secondary | ICD-10-CM | POA: Diagnosis not present

## 2024-10-06 DIAGNOSIS — Z125 Encounter for screening for malignant neoplasm of prostate: Secondary | ICD-10-CM | POA: Diagnosis not present

## 2024-10-06 DIAGNOSIS — R7303 Prediabetes: Secondary | ICD-10-CM | POA: Diagnosis not present

## 2024-10-06 DIAGNOSIS — E559 Vitamin D deficiency, unspecified: Secondary | ICD-10-CM | POA: Diagnosis not present

## 2024-10-06 DIAGNOSIS — Z0000 Encounter for general adult medical examination without abnormal findings: Secondary | ICD-10-CM | POA: Diagnosis not present

## 2024-10-06 DIAGNOSIS — E782 Mixed hyperlipidemia: Secondary | ICD-10-CM | POA: Diagnosis not present

## 2024-10-06 DIAGNOSIS — I10 Essential (primary) hypertension: Secondary | ICD-10-CM | POA: Diagnosis not present

## 2024-10-19 NOTE — Progress Notes (Incomplete)
 Assessment/Plan:    Mild cognitive impairment   Mark Hall is a very pleasant 83 y.o. 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  He continues to drive short distances.     Recommendations:   Follow up in   months. Repeat neuropsych evaluation in 12 to 18 months for diagnostic clarity and disease trajectory Start donepezil 10 mg daily as directed, side effects discussed*** Recommend psychotherapy for grief Recommend good control of cardiovascular risk factors Continue to control mood as per PCP Monitor driving    Subjective:   This patient is accompanied in the office by ***  who supplements the history. Previous records as well as any outside records available were reviewed prior to todays visit.   Patient was last seen on 07/14/2024 with [facility] 18/30***.    Any changes in memory since last visit? .  He continues to have difficulties with short-term memory, LTM is good repeats oneself?  Endorsed Disoriented when walking into a room?  Patient denies ***  Misplacing objects?  Patient denies   Wandering behavior?   Denies. Any personality changes since last visit? Denies.   Any worsening depression?: denies.   Hallucinations or paranoia?  Denies.   Seizures?   Denies.    Any sleep changes? Sleeps well***. Does not sleep very well***.   Denies vivid dreams, nightmares, REM behavior or sleepwalking   Sleep apnea?   denies ***  Any hygiene concerns?   Denies.   Independent of bathing and dressing?  Endorsed  Does the patient needs help with medications? Patient is in charge *** Who is in charge of the finances?  Daughter in charge   *** Any changes in appetite?  As before, he eats  smaller portions.***   Patient have trouble swallowing?  Denies.   Does the patient cook?  Any kitchen accidents such as leaving the stove on?   Denies.   Any headaches?    Denies.   Vision changes? Denies. Chronic pain?  Denies.   Ambulates with difficulty?  As before, he walks slower. ***  Recent falls or head injuries?    Denies.      Unilateral weakness, numbness or tingling?  Denies.   Any tremors?  Denies.   Any anosmia?    Denies.   Any incontinence of urine?  Frequency, no UTIs.   Any bowel dysfunction?  Denies.      Patient lives .*** Does the patient drive?  Yes, short distances, son is concerned about his driving***  Initial visit 07/14/2024 How long did patient have memory difficulties?  For about 1 year. Children noticed  the changes a few months ago, when he hell victim of a scam with [employer]  especially [employer]. Patient reports some difficulty remembering new information, recent conversations, names. LTM is good.  repeats oneself?  Endorsed Disoriented when walking into a room? Denies   Leaving objects in unusual places?  Denies.   Wandering behavior? Denies.   Any personality changes, or depression, anxiety? Denies   Hallucinations or paranoia? Denies.   Seizures? Denies.    Any sleep changes?  Sleeps well . Denies frequent nightmares or dream reenactment, other REM behavior or sleepwalking   Sleep apnea? Denies.   Any hygiene concerns?  Denies.   Independent of bathing and dressing? Endorsed  Does the patient need help with medications?Patient  is in charge   Who is in charge of the finances? Daughter is his FPOA.  Any changes in appetite?   I eat smaller portions, unintentionally Patient have trouble swallowing?  Denies.   Does the patient cook? No  Any headaches?  Denies.   Chronic pain? Denies.   Ambulates with difficulty? Walking slower than before.   Recent falls or head injuries? Denies.     Vision changes?  Denies any new issues  Occasional blurriness that last a second or two, 2 or 3 a year.  Any strokelike symptoms? Denies.   Any tremors? Denies.  Any anosmia? Denies.   Any incontinence of urine?  Frequency, no UTIs Any bowel dysfunction? Denies.      Patient lives alone.  History of heavy alcohol intake? Denies.   History of heavy tobacco use? Denies.   Family history of dementia?  Father with dementia ?type Does patient drive?    yes, a couple of times may have gone too far in the street. Son is concerned about him driving 35 miles to his house. His son is concerned about his driving.  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   Neuropsych evaluation 09/14/2024 Dr. Gayland Briefly, results indicated weaknesses in aspects of executive functioning and learning/memory (i.e., primarily in encoding and retrieval rather than storage). In the setting of largely preserved functional independence, findings support a diagnosis of mild cognitive impairment. Etiology is most likely related to cerebrovascular disease, though a mixed picture--including a neurodegenerative process such as Alzheimer's disease--cannot be ruled out given observed memory weakness. Additional contributing factors may include grief and aging.    CT of the head, personally reviewed 06/23/2024 remarkable for cerebral and cerebellar atrophy and minimal white matter microvascular ischemic changes, no acute intracranial findings  Past Medical History:  Diagnosis Date   Arthritis    Cancer (HCC)    skin cancer   Hypertension    Pneumonia      Past Surgical History:  Procedure Laterality Date   CHOLECYSTECTOMY     INGUINAL HERNIA REPAIR Left 02/28/2024   Procedure: OPEN LEFT INGUINAL HERNIA REPAIR WITH MESH;  Surgeon: Signe Mitzie LABOR, MD;  Location: [facility];  Service: General;  Laterality: Left;   KNEE ARTHROPLASTY Right    TOTAL KNEE ARTHROPLASTY Left 05/21/2022   Procedure: TOTAL KNEE ARTHROPLASTY;  Surgeon: Melodi Lerner, MD;   Location: [facility];  Service: Orthopedics;  Laterality: Left;     PREVIOUS MEDICATIONS:   CURRENT MEDICATIONS:  Outpatient Encounter Medications as of 10/20/2024  Medication Sig   amoxicillin (AMOXIL) 500 MG capsule Take 2,000 mg by mouth See admin instructions. Take 4 capsules (2000 mg) by mouth 1 hour prior to dental appointments (Patient not taking: Reported on 07/14/2024)   Cholecalciferol (VITAMIN D3) 50 MCG (2000 UT) TABS Take 2,000 Units by mouth at bedtime.   lisinopril (ZESTRIL) 20 MG tablet Take 20 mg by mouth in the morning. (Patient not taking: Reported on 09/14/2024)   Magnesium 250 MG TABS Take 250 mg by mouth at bedtime.   Melatonin 10 MG TABS Take 10 mg by mouth at bedtime.   simvastatin  (ZOCOR ) 10 MG tablet Take 10 mg by mouth every evening.   No facility-administered encounter medications on file as of 10/20/2024.     Objective:     PHYSICAL EXAMINATION:    VITALS:  There were no vitals filed  for this visit.  GEN:  The patient appears stated age and is in NAD. HEENT:  Normocephalic, atraumatic.   Neurological examination:  General: NAD, well-groomed, appears stated age. Orientation: The patient is alert. Oriented to person, place and not to date.*** Cranial nerves: There is good facial symmetry.The speech is fluent and clear. No aphasia or dysarthria. Fund of knowledge is appropriate. Recent memory impaired and remote memory is normal.  Attention and concentration are normal.  Able to name objects and repeat phrases.  Hearing is intact to conversational tone ***.   Delayed recall *** Sensation: Sensation is intact to light touch throughout Motor: Strength is at least antigravity x4. DTR's 2/4 in UE/LE      07/14/2024    2:00 PM  Montreal Cognitive Assessment   Visuospatial/ Executive (0/5) 3  Naming (0/3) 3  Attention: Read list of digits (0/2) 2  Attention: Read list of letters (0/1) 1  Attention: Serial 7 subtraction starting at 100 (0/3) 3  Language:  Repeat phrase (0/2) 2  Language : Fluency (0/1) 0  Abstraction (0/2) 1  Delayed Recall (0/5) 0  Orientation (0/6) 3  Total 18  Adjusted Score (based on education) 18        No data to display             Movement examination: Tone: There is normal tone in the UE/LE Abnormal movements:  no tremor.  No myoclonus.  No asterixis.   Coordination:  There is no decremation with RAM's. Normal finger to nose  Gait and Station: The patient has no difficulty arising out of a deep-seated chair without the use of the hands. The patient's stride length is good.  Gait is cautious and narrow.   Thank you for allowing us  the opportunity to participate in the care of this nice patient. Please do not hesitate to contact us  for any questions or concerns.   Total time spent on today's visit was *** minutes dedicated to this patient today, preparing to see patient, examining the patient, ordering tests and/or medications and counseling the patient, documenting clinical information in the EHR or other health record, independently interpreting results and communicating results to the patient/family, discussing treatment and goals, answering patient's questions and coordinating care.  Cc:  Marvene [name], [name]  Camie Sevin 10/19/2024 6:33 AM

## 2024-10-20 ENCOUNTER — Ambulatory Visit: Admitting: Physician Assistant

## 2024-10-22 NOTE — Progress Notes (Unsigned)
 Assessment/Plan:    Mild cognitive impairment, concerned for mixed etiology  Mark Hall is a very pleasant 83 y.o. 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  He continues to drive short distances. Memory is stable.Patient is able to participate on ADLs and to drive without difficulties       Recommendations:   Follow up in  6 months. Repeat neuropsych evaluation in 12 to 18 months for diagnostic clarity and disease trajectory Start donepezil 5 mg daily as directed, side effects discussed Recommend psychotherapy for grief Recommend good control of cardiovascular risk factors Continue to control mood as per PCP Monitor driving    Subjective:   This patient is accompanied in the office by his daughter  who supplements the history. Previous records as well as any outside records available were reviewed prior to todays visit.   Patient was last seen on 07/14/2024 with [facility] 18/30 .    Any changes in memory since last visit? About the same.  He continues to have difficulties with short-term memory, LTM is good repeats oneself?  Endorsed Disoriented when walking into a room?  Patient denies    Misplacing objects?  Patient denies   Wandering behavior?   Denies. Any personality changes since last visit? Denies.   Any worsening depression?: denies.   Hallucinations or paranoia?  Denies.   Seizures?   Denies.    Any sleep changes? Sleeps well. Denies vivid dreams, nightmares, REM behavior or sleepwalking   Sleep apnea?   denies    Any hygiene concerns?   Denies.   Independent of bathing and dressing?  Endorsed  Does the patient needs help with medications? Patient is in charge   Who is in  charge of the finances?  Daughter in charge     Any changes in appetite?  As before, he eats smaller portions.    Patient have trouble swallowing?  Denies.   Does the patient cook? Mostly microwave. Any kitchen accidents such as leaving the stove on?   Denies.   Any headaches?    Denies.   Vision changes? Denies. Chronic pain?  Denies.   Ambulates with difficulty?  As before, he walks slower.  Walks every morning  Recent falls or head injuries?    Denies.      Unilateral weakness, numbness or tingling?  Denies.   Any tremors?  Denies.   Any anosmia?    Denies.   Any incontinence of urine?  Frequency, no UTIs.   Any bowel dysfunction?  Denies.      Patient lives alone Does the patient drive?  Yes, short distances, son is concerned about his driving but daughter states that he is doing a good job.    Initial visit 07/14/2024 How long did patient have memory difficulties?  For about 1 year. Children noticed  the changes a few months ago, when he hell victim of a scam with [employer]  especially [employer]. Patient reports some difficulty remembering new information, recent conversations, names. LTM is good.  repeats oneself?  Endorsed Disoriented when walking into a room? Denies   Leaving objects in unusual places?  Denies.   Wandering behavior? Denies.   Any personality changes, or depression, anxiety? Denies   Hallucinations or paranoia?  Denies.   Seizures? Denies.    Any sleep changes?  Sleeps well . Denies frequent nightmares or dream reenactment, other REM behavior or sleepwalking   Sleep apnea? Denies.   Any hygiene concerns?  Denies.   Independent of bathing and dressing? Endorsed  Does the patient need help with medications?Patient  is in charge   Who is in charge of the finances? Daughter is his FPOA.  Any changes in appetite?   I eat smaller portions, unintentionally Patient have trouble swallowing?  Denies.   Does the patient cook? No  Any headaches?  Denies.    Chronic pain? Denies.   Ambulates with difficulty? Walking slower than before.   Recent falls or head injuries? Denies.     Vision changes?  Denies any new issues Occasional blurriness that last a second or two, 2 or 3 a year.  Any strokelike symptoms? Denies.   Any tremors? Denies.  Any anosmia? Denies.   Any incontinence of urine?  Frequency, no UTIs Any bowel dysfunction? Denies.      Patient lives alone.  History of heavy alcohol intake? Denies.   History of heavy tobacco use? Denies.   Family history of dementia?  Father with dementia ?type Does patient drive?    yes, a couple of times may have gone too far in the street. Son is concerned about him driving 35 miles to his house. His son is concerned about his driving.  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   Neuropsych evaluation 09/14/2024 Dr. Gayland Briefly, results indicated weaknesses in aspects of executive functioning and learning/memory (i.e., primarily in encoding and retrieval rather than storage). In the setting of largely preserved functional independence, findings support a diagnosis of mild cognitive impairment. Etiology is most likely related to cerebrovascular disease, though a mixed picture--including a neurodegenerative process such as Alzheimer's disease--cannot be ruled out given observed memory weakness. Additional contributing factors may include grief and aging.    CT of the head, personally reviewed 06/23/2024 remarkable for cerebral and cerebellar atrophy and minimal white matter microvascular ischemic changes, no acute intracranial findings  Past Medical History:  Diagnosis Date   Arthritis    Cancer (HCC)    skin cancer   Hypertension    Pneumonia      Past Surgical History:  Procedure Laterality Date   CHOLECYSTECTOMY     INGUINAL HERNIA REPAIR Left 02/28/2024   Procedure: OPEN LEFT INGUINAL HERNIA REPAIR WITH MESH;  Surgeon: Signe Mitzie LABOR, MD;  Location: [facility];  Service:  General;  Laterality: Left;   KNEE ARTHROPLASTY Right    TOTAL KNEE ARTHROPLASTY Left 05/21/2022   Procedure: TOTAL KNEE ARTHROPLASTY;  Surgeon: Melodi Lerner, MD;  Location: [facility];  Service: Orthopedics;  Laterality: Left;     PREVIOUS MEDICATIONS:   CURRENT MEDICATIONS:  Outpatient Encounter Medications as of 10/23/2024  Medication Sig   Cholecalciferol (VITAMIN D3) 50 MCG (2000 UT) TABS Take 2,000 Units by mouth at bedtime.   Magnesium 250 MG TABS Take 250 mg by mouth at bedtime.   Melatonin 10 MG TABS Take 10 mg by mouth at bedtime.   NIFEdipine (ADALAT CC) 30 MG 24 hr tablet Take 30 mg by mouth daily.   simvastatin  (ZOCOR ) 10 MG tablet Take 10 mg by mouth every evening.   amoxicillin (AMOXIL) 500 MG capsule Take 2,000 mg by mouth See admin instructions. Take 4 capsules (2000 mg) by mouth 1 hour prior to dental appointments (Patient not taking: Reported on 10/23/2024)  lisinopril (ZESTRIL) 20 MG tablet Take 20 mg by mouth in the morning. (Patient not taking: Reported on 10/23/2024)   No facility-administered encounter medications on file as of 10/23/2024.     Objective:     PHYSICAL EXAMINATION:    VITALS:   Vitals:   10/23/24 1244  BP: 121/71  SpO2: 93%  Weight: 165 lb (74.8 kg)  Height: 5' 10 (1.778 m)    GEN:  The patient appears stated age and is in NAD. HEENT:  Normocephalic, atraumatic.   Neurological examination:  General: NAD, well-groomed, appears stated age. Orientation: The patient is alert. Oriented to person, place and not to date.  Cranial nerves: There is good facial symmetry.The speech is fluent and clear. No aphasia or dysarthria. Fund of knowledge is appropriate. Recent memory impaired and remote memory is normal.  Attention and concentration are normal.  Able to name objects and repeat phrases.  Hearing is intact to conversational tone  .     Sensation: Sensation is intact to light touch throughout Motor: Strength is at least antigravity  x4. DTR's 2/4 in UE/LE      07/14/2024    2:00 PM  Montreal Cognitive Assessment   Visuospatial/ Executive (0/5) 3  Naming (0/3) 3  Attention: Read list of digits (0/2) 2  Attention: Read list of letters (0/1) 1  Attention: Serial 7 subtraction starting at 100 (0/3) 3  Language: Repeat phrase (0/2) 2  Language : Fluency (0/1) 0  Abstraction (0/2) 1  Delayed Recall (0/5) 0  Orientation (0/6) 3  Total 18  Adjusted Score (based on education) 18        No data to display             Movement examination: Tone: There is normal tone in the UE/LE Abnormal movements:  no tremor.  No myoclonus.  No asterixis.   Coordination:  There is no decremation with RAM's. Normal finger to nose  Gait and Station: The patient has no difficulty arising out of a deep-seated chair without the use of the hands. The patient's stride length is good.  Gait is cautious and narrow.   Thank you for allowing us  the opportunity to participate in the care of this nice patient. Please do not hesitate to contact us  for any questions or concerns.   Total time spent on today's visit was 3- minutes dedicated to this patient today, preparing to see patient, examining the patient, ordering tests and/or medications and counseling the patient, documenting clinical information in the EHR or other health record, independently interpreting results and communicating results to the patient/family, discussing treatment and goals, answering patient's questions and coordinating care.  Cc:  Marvene [name], [name]  Camie Sevin 10/23/2024 1:28 PM

## 2024-10-23 ENCOUNTER — Encounter: Payer: Self-pay | Admitting: Physician Assistant

## 2024-10-23 ENCOUNTER — Ambulatory Visit: Admitting: Physician Assistant

## 2024-10-23 VITALS — BP 121/71 | Ht 70.0 in | Wt 165.0 lb

## 2024-10-23 DIAGNOSIS — G3184 Mild cognitive impairment, so stated: Secondary | ICD-10-CM

## 2024-10-23 MED ORDER — DONEPEZIL HCL 5 MG PO TABS: 5.0000 mg | ORAL_TABLET | Freq: Every day | ORAL | 3 refills | Status: AC

## 2024-10-23 NOTE — Patient Instructions (Signed)
 It was a pleasure to see you today at our office.   Recommendations:   We will start donepezil  5 mg daily.    Replenish B12  Monitor the thyroid  levels  Consider grief counseling, continue [employer] activities. Follow up in 6 months     [email or web address]   RECOMMENDATIONS FOR ALL PATIENTS WITH MEMORY PROBLEMS: 1. Continue to exercise (Recommend 30 minutes of walking everyday, or 3 hours every week) 2. Increase social interactions - continue going to [place] and enjoy social gatherings with friends and family 3. Eat healthy, avoid fried foods and eat more fruits and vegetables 4. Maintain adequate blood pressure, blood sugar, and blood cholesterol level. Reducing the risk of stroke and cardiovascular disease also helps promoting better memory. 5. Avoid stressful situations. Live a simple life and avoid aggravations. Organize your time and prepare for the next day in anticipation. 6. Sleep well, avoid any interruptions of sleep and avoid any distractions in the bedroom that may interfere with adequate sleep quality 7. Avoid sugar, avoid sweets as there is a strong link between excessive sugar intake, diabetes, and cognitive impairment We discussed the Mediterranean diet, which has been shown to help patients reduce the risk of progressive memory disorders and reduces cardiovascular risk. This includes eating fish, eat fruits and green leafy vegetables, nuts like almonds and hazelnuts, walnuts, and also use olive oil. Avoid fast foods and fried foods as much as possible. Avoid sweets and sugar as sugar use has been linked to worsening of memory function.  There is always a concern of gradual progression of memory problems. If this is the case, then we may need to adjust level of care according to patient needs. Support, both to the patient and caregiver, should then be put into place.       DRIVING: Regarding driving, in patients  with progressive memory problems, driving will be impaired. We advise to have someone else do the driving if trouble finding directions or if minor accidents are reported. Independent driving assessment is available to determine safety of driving.   If you are interested in the driving assessment, you can contact the following:  [employer] in [place] [phone]  [facility] [phone]  [facility] [phone]  [facility] [phone] or [phone]   FALL PRECAUTIONS: Be cautious when walking. Scan the area for obstacles that may increase the risk of trips and falls. When getting up in the mornings, sit up at the edge of the bed for a few minutes before getting out of bed. Consider elevating the bed at the head end to avoid drop of blood pressure when getting up. Walk always in a well-lit room (use night lights in the walls). Avoid area rugs or power cords from appliances in the middle of the walkways. Use a walker or a cane if necessary and consider physical therapy for balance exercise. Get your eyesight checked regularly.  FINANCIAL OVERSIGHT: Supervision, especially oversight when making financial decisions or transactions is also recommended.  HOME SAFETY: Consider the safety of the kitchen when operating appliances like stoves, microwave oven, and blender. Consider having supervision and share cooking responsibilities until no longer able to participate in those. Accidents with firearms and other hazards in the house should be identified and addressed as well.   ABILITY TO BE LEFT ALONE: If patient is unable to contact 911 operator, consider using LifeLine, or when the need is there, arrange for someone to stay with patients. Smoking is a Air cabin crew hazard,  consider supervision or cessation. Risk of wandering should be assessed by caregiver and if detected at any point, supervision and safe proof recommendations should be  instituted.  MEDICATION SUPERVISION: Inability to self-administer medication needs to be constantly addressed. Implement a mechanism to ensure safe administration of the medications.      Mediterranean Diet A Mediterranean diet refers to food and lifestyle choices that are based on the traditions of countries located on the [employer]. This way of eating has been shown to help prevent certain conditions and improve outcomes for people who have chronic diseases, like kidney disease and heart disease. What are tips for following this plan? Lifestyle  Cook and eat meals together with your family, when possible. Drink enough fluid to keep your urine clear or pale yellow. Be physically active every day. This includes: Aerobic exercise like running or swimming. Leisure activities like gardening, walking, or housework. Get 7-8 hours of sleep each night. If recommended by your health care provider, drink red wine in moderation. This means 1 glass a day for nonpregnant women and 2 glasses a day for men. A glass of wine equals 5 oz (150 mL). Reading food labels  Check the serving size of packaged foods. For foods such as rice and pasta, the serving size refers to the amount of cooked product, not dry. Check the total fat in packaged foods. Avoid foods that have saturated fat or trans fats. Check the ingredients list for added sugars, such as corn syrup. Shopping  At the grocery store, buy most of your food from the areas near the walls of the store. This includes: Fresh fruits and vegetables (produce). Grains, beans, nuts, and seeds. Some of these may be available in unpackaged forms or large amounts (in bulk). Fresh seafood. Poultry and eggs. Low-fat dairy products. Buy whole ingredients instead of prepackaged foods. Buy fresh fruits and vegetables in-season from local farmers markets. Buy frozen fruits and vegetables in resealable bags. If you do not have access to quality fresh  seafood, buy precooked frozen shrimp or canned fish, such as tuna, salmon, or sardines. Buy small amounts of raw or cooked vegetables, salads, or olives from the deli or salad bar at your store. Stock your pantry so you always have certain foods on hand, such as olive oil, canned tuna, canned tomatoes, rice, pasta, and beans. Cooking  Cook foods with extra-virgin olive oil instead of using butter or other vegetable oils. Have meat as a side dish, and have vegetables or grains as your main dish. This means having meat in small portions or adding small amounts of meat to foods like pasta or stew. Use beans or vegetables instead of meat in common dishes like chili or lasagna. Experiment with different cooking methods. Try roasting or broiling vegetables instead of steaming or sauteing them. Add frozen vegetables to soups, stews, pasta, or rice. Add nuts or seeds for added healthy fat at each meal. You can add these to yogurt, salads, or vegetable dishes. Marinate fish or vegetables using olive oil, lemon juice, garlic, and fresh herbs. Meal planning  Plan to eat 1 vegetarian meal one day each week. Try to work up to 2 vegetarian meals, if possible. Eat seafood 2 or more times a week. Have healthy snacks readily available, such as: Vegetable sticks with hummus. Greek yogurt. Fruit and nut trail mix. Eat balanced meals throughout the week. This includes: Fruit: 2-3 servings a day Vegetables: 4-5 servings a day Low-fat dairy: 2 servings a day Fish, poultry,  or lean meat: 1 serving a day Beans and legumes: 2 or more servings a week Nuts and seeds: 1-2 servings a day Whole grains: 6-8 servings a day Extra-virgin olive oil: 3-4 servings a day Limit red meat and sweets to only a few servings a month What are my food choices? Mediterranean diet Recommended Grains: Whole-grain pasta. Brown rice. Bulgar wheat. Polenta. Couscous. Whole-wheat bread. [place] Madeira. Vegetables: Artichokes. Beets.  Broccoli. Cabbage. Carrots. Eggplant. Green beans. Chard. Kale. Spinach. Onions. Leeks. Peas. Squash. Tomatoes. Peppers. Radishes. Fruits: Apples. Apricots. Avocado. Berries. Bananas. Cherries. Dates. Figs. Grapes. Lemons. Melon. Oranges. Peaches. Plums. Pomegranate. Meats and other protein foods: Beans. Almonds. Sunflower seeds. Pine nuts. Peanuts. Cod. Salmon. Scallops. Shrimp. Tuna. Tilapia. Clams. Oysters. Eggs. Dairy: Low-fat milk. Cheese. Greek yogurt. Beverages: Water. Red wine. Herbal tea. Fats and oils: Extra virgin olive oil. Avocado oil. Grape seed oil. Sweets and desserts: [place] yogurt with honey. Baked apples. Poached pears. Trail mix. Seasoning and other foods: Basil. Cilantro. Coriander. Cumin. Mint. Parsley. Sage. Rosemary. Tarragon. Garlic. Oregano. Thyme. Pepper. Balsalmic vinegar. Tahini. Hummus. Tomato sauce. Olives. Mushrooms. Limit these Grains: Prepackaged pasta or rice dishes. Prepackaged cereal with added sugar. Vegetables: Deep fried potatoes (french fries). Fruits: Fruit canned in syrup. Meats and other protein foods: Beef. Pork. Lamb. Poultry with skin. Hot dogs. [place]. Dairy: Ice cream. Sour cream. Whole milk. Beverages: Juice. Sugar-sweetened soft drinks. Beer. Liquor and spirits. Fats and oils: Butter. Canola oil. Vegetable oil. Beef fat (tallow). Lard. Sweets and desserts: Cookies. Cakes. Pies. Candy. Seasoning and other foods: Mayonnaise. Premade sauces and marinades. The items listed may not be a complete list. Talk with your dietitian about what dietary choices are right for you. Summary The Mediterranean diet includes both food and lifestyle choices. Eat a variety of fresh fruits and vegetables, beans, nuts, seeds, and whole grains. Limit the amount of red meat and sweets that you eat. Talk with your health care provider about whether it is safe for you to drink red wine in moderation. This means 1 glass a day for nonpregnant women and 2 glasses a day for  men. A glass of wine equals 5 oz (150 mL). This information is not intended to replace advice given to you by your health care provider. Make sure you discuss any questions you have with your health care provider. Document Released: 08/09/2016 Document Revised: 09/11/2016 Document Reviewed: 08/09/2016 Elsevier Interactive Patient Education  2017 [employer].

## 2024-11-08 ENCOUNTER — Encounter: Payer: Self-pay | Admitting: Physician Assistant

## 2024-12-14 ENCOUNTER — Emergency Department (HOSPITAL_COMMUNITY)
Admission: EM | Admit: 2024-12-14 | Discharge: 2024-12-14 | Discharge status: Against Medical Advice | Attending: Emergency Medicine | Admitting: Emergency Medicine

## 2024-12-14 ENCOUNTER — Encounter (HOSPITAL_COMMUNITY): Payer: Self-pay

## 2024-12-14 ENCOUNTER — Other Ambulatory Visit: Payer: Self-pay

## 2024-12-14 DIAGNOSIS — M79674 Pain in right toe(s): Secondary | ICD-10-CM | POA: Insufficient documentation

## 2024-12-14 DIAGNOSIS — Z5321 Procedure and treatment not carried out due to patient leaving prior to being seen by health care provider: Secondary | ICD-10-CM | POA: Insufficient documentation

## 2024-12-14 LAB — CBC WITH DIFFERENTIAL/PLATELET
Abs Immature Granulocytes: 0.15 K/uL — ABNORMAL HIGH (ref 0.00–0.07)
Basophils Absolute: 0.1 K/uL (ref 0.0–0.1)
Basophils Relative: 1 %
Eosinophils Absolute: 0.6 K/uL — ABNORMAL HIGH (ref 0.0–0.5)
Eosinophils Relative: 6 %
HCT: 41.6 % (ref 39.0–52.0)
Hemoglobin: 13.2 g/dL (ref 13.0–17.0)
Immature Granulocytes: 2 %
Lymphocytes Relative: 17 %
Lymphs Abs: 1.6 K/uL (ref 0.7–4.0)
MCH: 31.7 pg (ref 26.0–34.0)
MCHC: 31.7 g/dL (ref 30.0–36.0)
MCV: 100 fL (ref 80.0–100.0)
Monocytes Absolute: 1.5 K/uL — ABNORMAL HIGH (ref 0.1–1.0)
Monocytes Relative: 15 %
Neutro Abs: 5.8 K/uL (ref 1.7–7.7)
Neutrophils Relative %: 59 %
Platelets: 353 K/uL (ref 150–400)
RBC: 4.16 MIL/uL — ABNORMAL LOW (ref 4.22–5.81)
RDW: 12.8 % (ref 11.5–15.5)
WBC: 9.8 K/uL (ref 4.0–10.5)
nRBC: 0 % (ref 0.0–0.2)

## 2024-12-14 LAB — COMPREHENSIVE METABOLIC PANEL WITH GFR
ALT: 16 U/L (ref 0–44)
AST: 24 U/L (ref 15–41)
Albumin: 3.4 g/dL — ABNORMAL LOW (ref 3.5–5.0)
Alkaline Phosphatase: 64 U/L (ref 38–126)
Anion gap: 7 (ref 5–15)
BUN: 14 mg/dL (ref 8–23)
CO2: 30 mmol/L (ref 22–32)
Calcium: 8.8 mg/dL — ABNORMAL LOW (ref 8.9–10.3)
Chloride: 97 mmol/L — ABNORMAL LOW (ref 98–111)
Creatinine, Ser: 0.94 mg/dL (ref 0.61–1.24)
GFR, Estimated: >60 mL/min (ref >60)
Glucose, Bld: 114 mg/dL — ABNORMAL HIGH (ref 70–99)
Potassium: 4.1 mmol/L (ref 3.5–5.1)
Sodium: 134 mmol/L — ABNORMAL LOW (ref 135–145)
Total Bilirubin: 0.6 mg/dL (ref 0.0–1.2)
Total Protein: 7.1 g/dL (ref 6.5–8.1)

## 2024-12-14 LAB — I-STAT CG4 LACTIC ACID, ED: Lactic Acid, Venous: 0.9 mmol/L (ref 0.5–1.9)

## 2024-12-14 MED ORDER — TETANUS-DIPHTH-ACELL PERTUSSIS 5-2-15.5 LF-MCG/0.5 IM SUSP
0.5000 mL | Freq: Once | INTRAMUSCULAR | Status: AC
  Administered 2024-12-14: 20:00:00 0.5 mL via INTRAMUSCULAR
  Filled 2024-12-14: qty 0.5

## 2024-12-14 NOTE — ED Triage Notes (Addendum)
 Quick triage note: Pt to ED from PCP c/o right great toe injury, reports stubbed his toe a week ago, reports swelling and bruising, sent here to r/o infection

## 2024-12-14 NOTE — ED Provider Triage Note (Signed)
 Emergency Medicine Provider Triage Evaluation Note  Mark Hall , a 83 y.o. male  was evaluated in triage.  Pt complains of R great toe wound, redness, pain with an injury 1 week ago. Had fracture on imaging earlier today and sent here for further workup.  Review of Systems  Positive: wound Negative: Fever  Physical Exam  BP (!) 149/94 (BP Location: Left Arm)   Pulse (!) 114   Temp 97.8 F (36.6 C)   Resp 16   SpO2 95%  Gen:   Awake, no distress   Resp:  Normal effort  MSK:   Moves extremities without difficulty  Other:  R great toe pain, neurovasc intact  Medical Decision Making  Medically screening exam initiated at 8:17 PM.  Appropriate orders placed.  Mark Hall was informed that the remainder of the evaluation will be completed by another provider, this initial triage assessment does not replace that evaluation, and the importance of remaining in the ED until their evaluation is complete.     Mark Warren SAILOR, PA-C 12/14/24 2018

## 2024-12-14 NOTE — ED Notes (Signed)
Pt decided to go home while waiting for a room. 

## 2024-12-14 NOTE — ED Triage Notes (Signed)
 Pt states was sent here by [facility] physicians, because the x-ray of his right big toe showed it was broken. Pt's toe has 2+ swelling and is deformed. Pt's toe is erythematous. Pt has a wound on posterior of toe.

## 2024-12-15 ENCOUNTER — Telehealth: Payer: Self-pay

## 2024-12-15 NOTE — Telephone Encounter (Signed)
 Daughter called to see about getting an antibiotic prescribed for her father. Her father came to the ED yesterday but left before being seen. Instructed daughter to have her father follow-up c/his PCP or Urgent Care.

## 2024-12-16 ENCOUNTER — Inpatient Hospital Stay (HOSPITAL_COMMUNITY)
Admission: EM | Admit: 2024-12-16 | Discharge: 2024-12-17 | DRG: 503 | Disposition: A | Discharge status: Home / Self Care | Source: Ambulatory Visit | Attending: Internal Medicine | Admitting: Internal Medicine

## 2024-12-16 ENCOUNTER — Encounter (HOSPITAL_COMMUNITY): Payer: Self-pay | Admitting: *Deleted

## 2024-12-16 ENCOUNTER — Emergency Department (HOSPITAL_COMMUNITY)

## 2024-12-16 ENCOUNTER — Other Ambulatory Visit: Payer: Self-pay

## 2024-12-16 DIAGNOSIS — Z9889 Other specified postprocedural states: Secondary | ICD-10-CM

## 2024-12-16 DIAGNOSIS — Y93E1 Activity, personal bathing and showering: Secondary | ICD-10-CM | POA: Diagnosis not present

## 2024-12-16 DIAGNOSIS — Z888 Allergy status to other drugs, medicaments and biological substances status: Secondary | ICD-10-CM

## 2024-12-16 DIAGNOSIS — S92421B Displaced fracture of distal phalanx of right great toe, initial encounter for open fracture: Principal | ICD-10-CM | POA: Diagnosis present

## 2024-12-16 DIAGNOSIS — E538 Deficiency of other specified B group vitamins: Secondary | ICD-10-CM | POA: Diagnosis present

## 2024-12-16 DIAGNOSIS — W182XXA Fall in (into) shower or empty bathtub, initial encounter: Secondary | ICD-10-CM | POA: Diagnosis present

## 2024-12-16 DIAGNOSIS — L03031 Cellulitis of right toe: Secondary | ICD-10-CM | POA: Diagnosis present

## 2024-12-16 DIAGNOSIS — W1800XA Striking against unspecified object with subsequent fall, initial encounter: Secondary | ICD-10-CM | POA: Diagnosis present

## 2024-12-16 DIAGNOSIS — Z79899 Other long term (current) drug therapy: Secondary | ICD-10-CM | POA: Diagnosis not present

## 2024-12-16 DIAGNOSIS — M79674 Pain in right toe(s): Secondary | ICD-10-CM | POA: Diagnosis present

## 2024-12-16 DIAGNOSIS — I10 Essential (primary) hypertension: Secondary | ICD-10-CM | POA: Diagnosis present

## 2024-12-16 DIAGNOSIS — M1990 Unspecified osteoarthritis, unspecified site: Secondary | ICD-10-CM | POA: Diagnosis present

## 2024-12-16 DIAGNOSIS — Z85828 Personal history of other malignant neoplasm of skin: Secondary | ICD-10-CM | POA: Diagnosis not present

## 2024-12-16 DIAGNOSIS — Z9049 Acquired absence of other specified parts of digestive tract: Secondary | ICD-10-CM

## 2024-12-16 DIAGNOSIS — S065XAA Traumatic subdural hemorrhage with loss of consciousness status unknown, initial encounter: Secondary | ICD-10-CM | POA: Diagnosis present

## 2024-12-16 DIAGNOSIS — Z882 Allergy status to sulfonamides status: Secondary | ICD-10-CM | POA: Diagnosis not present

## 2024-12-16 DIAGNOSIS — Z96653 Presence of artificial knee joint, bilateral: Secondary | ICD-10-CM | POA: Diagnosis present

## 2024-12-16 DIAGNOSIS — G3184 Mild cognitive impairment, so stated: Secondary | ICD-10-CM | POA: Diagnosis present

## 2024-12-16 DIAGNOSIS — E785 Hyperlipidemia, unspecified: Secondary | ICD-10-CM | POA: Diagnosis present

## 2024-12-16 DIAGNOSIS — Z5321 Procedure and treatment not carried out due to patient leaving prior to being seen by health care provider: Secondary | ICD-10-CM | POA: Diagnosis present

## 2024-12-16 DIAGNOSIS — Y92091 Bathroom in other non-institutional residence as the place of occurrence of the external cause: Secondary | ICD-10-CM | POA: Diagnosis not present

## 2024-12-16 DIAGNOSIS — Z23 Encounter for immunization: Secondary | ICD-10-CM | POA: Diagnosis present

## 2024-12-16 DIAGNOSIS — Z8701 Personal history of pneumonia (recurrent): Secondary | ICD-10-CM

## 2024-12-16 LAB — COMPREHENSIVE METABOLIC PANEL WITH GFR
ALT: 16 U/L (ref 0–44)
AST: 28 U/L (ref 15–41)
Albumin: 4 g/dL (ref 3.5–5.0)
Alkaline Phosphatase: 75 U/L (ref 38–126)
Anion gap: 10 (ref 5–15)
BUN: 14 mg/dL (ref 8–23)
CO2: 28 mmol/L (ref 22–32)
Calcium: 9.2 mg/dL (ref 8.9–10.3)
Chloride: 96 mmol/L — ABNORMAL LOW (ref 98–111)
Creatinine, Ser: 0.84 mg/dL (ref 0.61–1.24)
GFR, Estimated: >60 mL/min (ref >60)
Glucose, Bld: 108 mg/dL — ABNORMAL HIGH (ref 70–99)
Potassium: 4 mmol/L (ref 3.5–5.1)
Sodium: 133 mmol/L — ABNORMAL LOW (ref 135–145)
Total Bilirubin: 0.4 mg/dL (ref 0.0–1.2)
Total Protein: 7.3 g/dL (ref 6.5–8.1)

## 2024-12-16 LAB — CBC WITH DIFFERENTIAL/PLATELET
Abs Immature Granulocytes: 0.12 K/uL — ABNORMAL HIGH (ref 0.00–0.07)
Basophils Absolute: 0.1 K/uL (ref 0.0–0.1)
Basophils Relative: 1 %
Eosinophils Absolute: 0.4 K/uL (ref 0.0–0.5)
Eosinophils Relative: 5 %
HCT: 40.6 % (ref 39.0–52.0)
Hemoglobin: 13.2 g/dL (ref 13.0–17.0)
Immature Granulocytes: 1 %
Lymphocytes Relative: 16 %
Lymphs Abs: 1.5 K/uL (ref 0.7–4.0)
MCH: 32.5 pg (ref 26.0–34.0)
MCHC: 32.5 g/dL (ref 30.0–36.0)
MCV: 100 fL (ref 80.0–100.0)
Monocytes Absolute: 1 K/uL (ref 0.1–1.0)
Monocytes Relative: 11 %
Neutro Abs: 6.1 K/uL (ref 1.7–7.7)
Neutrophils Relative %: 66 %
Platelets: 351 K/uL (ref 150–400)
RBC: 4.06 MIL/uL — ABNORMAL LOW (ref 4.22–5.81)
RDW: 12.6 % (ref 11.5–15.5)
WBC: 9.3 K/uL (ref 4.0–10.5)
nRBC: 0 % (ref 0.0–0.2)

## 2024-12-16 LAB — I-STAT CG4 LACTIC ACID, ED: Lactic Acid, Venous: 0.8 mmol/L (ref 0.5–1.9)

## 2024-12-16 MED ORDER — VANCOMYCIN HCL IN DEXTROSE 1-5 GM/200ML-% IV SOLN
1000.0000 mg | Freq: Once | INTRAVENOUS | Status: AC
  Administered 2024-12-16: 22:00:00 1000 mg via INTRAVENOUS
  Filled 2024-12-16: qty 200

## 2024-12-16 MED ORDER — ACETAMINOPHEN 325 MG PO TABS
650.0000 mg | ORAL_TABLET | Freq: Once | ORAL | Status: AC
  Administered 2024-12-16: 22:00:00 650 mg via ORAL
  Filled 2024-12-16: qty 2

## 2024-12-16 MED ORDER — ACETAMINOPHEN 650 MG RE SUPP: 650.0000 mg | Freq: Four times a day (QID) | RECTAL | Status: DC | PRN

## 2024-12-16 MED ORDER — MELATONIN 5 MG PO TABS
10.0000 mg | ORAL_TABLET | Freq: Every day | ORAL | Status: DC
  Administered 2024-12-16: 10 mg via ORAL
  Filled 2024-12-16: qty 2

## 2024-12-16 MED ORDER — ONDANSETRON HCL 4 MG/2ML IJ SOLN: 4.0000 mg | Freq: Four times a day (QID) | INTRAMUSCULAR | Status: DC | PRN

## 2024-12-16 MED ORDER — SODIUM CHLORIDE 0.9 % IV SOLN
2.0000 g | INTRAVENOUS | Status: DC
  Administered 2024-12-16: 23:00:00 2 g via INTRAVENOUS
  Filled 2024-12-16: qty 20

## 2024-12-16 MED ORDER — SODIUM CHLORIDE 0.9% FLUSH: 3.0000 mL | Freq: Two times a day (BID) | INTRAVENOUS | Status: DC

## 2024-12-16 MED ORDER — DONEPEZIL HCL 5 MG PO TABS
5.0000 mg | ORAL_TABLET | Freq: Every day | ORAL | Status: DC
  Administered 2024-12-17: 10:00:00 5 mg via ORAL
  Filled 2024-12-16: qty 1

## 2024-12-16 MED ORDER — NIFEDIPINE ER OSMOTIC RELEASE 30 MG PO TB24
30.0000 mg | ORAL_TABLET | Freq: Every day | ORAL | Status: DC
  Administered 2024-12-17: 13:00:00 30 mg via ORAL
  Filled 2024-12-16 (×2): qty 1

## 2024-12-16 MED ORDER — OXYCODONE HCL 5 MG PO TABS
5.0000 mg | ORAL_TABLET | ORAL | Status: DC | PRN
  Administered 2024-12-17: 10:00:00 5 mg via ORAL
  Filled 2024-12-16: qty 1

## 2024-12-16 MED ORDER — BISACODYL 5 MG PO TBEC: 5.0000 mg | DELAYED_RELEASE_TABLET | Freq: Every day | ORAL | Status: DC | PRN

## 2024-12-16 MED ORDER — PANTOPRAZOLE SODIUM 40 MG PO TBEC
80.0000 mg | DELAYED_RELEASE_TABLET | Freq: Every day | ORAL | Status: DC
  Administered 2024-12-17: 10:00:00 80 mg via ORAL
  Filled 2024-12-16: qty 2

## 2024-12-16 MED ORDER — VANCOMYCIN HCL 750 MG/150ML IV SOLN
750.0000 mg | Freq: Two times a day (BID) | INTRAVENOUS | Status: DC
  Administered 2024-12-17: 13:00:00 750 mg via INTRAVENOUS
  Filled 2024-12-16 (×2): qty 150

## 2024-12-16 MED ORDER — SIMVASTATIN 20 MG PO TABS: 10.0000 mg | ORAL_TABLET | Freq: Every evening | ORAL | Status: DC

## 2024-12-16 MED ORDER — SENNOSIDES-DOCUSATE SODIUM 8.6-50 MG PO TABS: 1.0000 | ORAL_TABLET | Freq: Every evening | ORAL | Status: DC | PRN

## 2024-12-16 MED ORDER — ACETAMINOPHEN 325 MG PO TABS
650.0000 mg | ORAL_TABLET | Freq: Four times a day (QID) | ORAL | Status: DC | PRN
  Administered 2024-12-17: 09:00:00 650 mg via ORAL
  Filled 2024-12-16: qty 2

## 2024-12-16 MED ORDER — ONDANSETRON HCL 4 MG PO TABS: 4.0000 mg | ORAL_TABLET | Freq: Four times a day (QID) | ORAL | Status: DC | PRN

## 2024-12-16 NOTE — Progress Notes (Addendum)
 Reason for Consult:SDH Referring Physician: EDP  Mark Hall is an 83 y.o. male.   HPI:  83 year old male presented to the hospital tonight with complaints of a right big toe infection. Wife states that he fell and hit his head a week ago. He had some headaches but none right now. Wife states he was a  little confused yesterday. He was sent to the ED by his pcp for the toe infection. Denies any dizziness or vision changes.   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 No pertinent family history.   Review of Systems  Positive ROS: as above   All other systems have been reviewed and were otherwise negative with the exception of those mentioned in the HPI and as above.  Objective: Vital signs in last 24 hours: Temp:  [97.9 F (36.6 C)-98.5 F (36.9 C)] 98.5 F (36.9 C) (12/17 2201) Pulse Rate:  [97-99] 99 (12/17 2201) Resp:  [16-17] 17 (12/17 2201) BP: (151-178)/(75-105) 178/105 (12/17 2201) SpO2:  [96 %-100 %] 100 % (12/17 2201) Weight:  [74.8 kg] 74.8 kg (12/17 1841)  General Appearance: Alert, cooperative, no distress, appears stated age Head: Normocephalic, without obvious abnormality, atraumatic Eyes: PERRL, conjunctiva/corneas clear, EOM's intact, fundi benign, both  eyes      Lungs: respirations unlabored Heart: Regular rate and rhythm Pulses: 2+ and symmetric all extremities Skin: Skin color, texture, turgor normal, no rashes or lesions, right big toe infection  NEUROLOGIC:   Mental status: A&O x4, no aphasia, good attention span, Memory and fund of knowledge Motor Exam - grossly normal, normal tone and bulk Sensory Exam - grossly normal Reflexes: symmetric, no pathologic reflexes, No Hoffman's, No clonus Coordination - grossly normal Gait - not tested Balance - not tested Cranial Nerves: I: smell Not tested  II: visual acuity  OS: na    OD: na  II: visual fields Full to confrontation  II: pupils Equal, round, reactive to light  III,VII: ptosis None  III,IV,VI: extraocular muscles  Full ROM  V: mastication Normal  V: facial light touch sensation  Normal  V,VII: corneal reflex  Present  VII: facial muscle function - upper  Normal  VII: facial muscle function - lower Normal  VIII: hearing Not tested  IX: soft palate elevation  Normal  IX,X: gag reflex Present  XI: trapezius strength  5/5  XI: sternocleidomastoid strength 5/5  XI: neck flexion strength  5/5  XII: tongue strength  Normal    Data Review Lab Results  Component Value Date   WBC 9.3 12/16/2024   HGB 13.2 12/16/2024   HCT 40.6 12/16/2024   MCV 100.0 12/16/2024   PLT 351 12/16/2024  Lab Results  Component Value Date   NA 133 (L) 12/16/2024   K 4.0 12/16/2024   CL 96 (L) 12/16/2024   CO2 28 12/16/2024   BUN 14 12/16/2024   CREATININE 0.84 12/16/2024   GLUCOSE 108 (H) 12/16/2024   No results found for: INR, PROTIME  Radiology: DG Toe Great Right Result Date: 12/16/2024 EXAM: 2 VIEW(S) XRAY OF THE RIGHT GREAT TOE 12/16/2024 07:44:38 PM COMPARISON: X-ray right great toe 12/14/2024. CLINICAL HISTORY: FINDINGS: BONES AND JOINTS: Acute intraarticular comminuted and displaced fracture of the distal phalangeal base and body of the great toe. Questionable internal  cortical erosion of the fracture fragments on the lateral view . The fracture extends into the interphalangeal joint. There is an overlying bone fragment. Degenerative changes are present. SOFT TISSUES: Soft tissue defect along the dorsal aspect of the first digit. Soft tissue edema and emphysema are noted. Soft tissue swelling is present. IMPRESSION: 1. Open, acute, intra-articular, comminuted, and displaced fracture of the distal phalanx base and body of the right great toe. 2. Question osseous erosion of the fracture fragments that may represent superimposed infection/osteomyelitis. Recommend close attention on follow-up for developing infection. Electronically signed by: Morgane Naveau MD 12/16/2024 07:52 PM EST RP Workstation: [id number]   CT Cervical Spine Wo Contrast Result Date: 12/16/2024 EXAM: CT CERVICAL SPINE WITHOUT CONTRAST 12/16/2024 07:27:32 PM TECHNIQUE: CT of the cervical spine was performed without the administration of intravenous contrast. Multiplanar reformatted images are provided for review. Automated exposure control, iterative reconstruction, and/or weight based adjustment of the mA/kV was utilized to reduce the radiation dose to as low as reasonably achievable. COMPARISON: None available. CLINICAL HISTORY: fall FINDINGS: BONES AND ALIGNMENT: No acute fracture or traumatic malalignment. DEGENERATIVE CHANGES: Multilevel moderate degenerative changes of the spine. Associated multilevel at least moderate osseous neural foraminal stenosis. No severe osseous central canal stenosis. SOFT TISSUES: No prevertebral soft tissue swelling. Atherosclerotic calcific carotid arteries within the neck. IMPRESSION: 1. No evidence of acute traumatic injury. Electronically signed by: Morgane Naveau MD 12/16/2024 07:41 PM EST RP Workstation: [id number]   CT Head Wo Contrast Result Date: 12/16/2024 EXAM: CT HEAD WITHOUT CONTRAST 12/16/2024 07:27:32 PM TECHNIQUE: CT of the head was performed without the  administration of intravenous contrast. Automated exposure control, iterative reconstruction, and/or weight based adjustment of the mA/kV was utilized to reduce the radiation dose to as low as reasonably achievable. COMPARISON: CT head 06/23/2024. CLINICAL HISTORY: Mental status change, unknown cause; fall week ago. FINDINGS: BRAIN AND VENTRICLES: Ventral development of a subacute 10 mm right subdural hematoma. Associated 1 mm right to left midline shift. No definite acute component. Atherosclerotic calcifications are present within the cavernous internal carotid arteries. No evidence of acute infarct. No hydrocephalus. ORBITS: Bilateral lens replacement. SINUSES: No acute abnormality. SOFT TISSUES AND SKULL: No acute soft tissue abnormality. No skull fracture. IMPRESSION: 1. Subacute 10 mm right subdural hematoma with associated 1 mm right-to-left midline shift, without definite acute component. Electronically signed by: Morgane Naveau MD 12/16/2024 07:38 PM EST RP Workstation: [id number]    Assessment/Plan: 83 year old male presented to the ED today for a toe infection. They did a CT of his head because he hit his head a week ago and it showed a right subacute SDH with no midline shift or mass effect, measuring about 10mm. We are going to recommend Dr. Lanis take a look at this and see if he would be a candidate for an MMA. No surgical intervention warranted right now.    Mark Hall  Mark Hall 12/16/2024 11:08 PM       [1]  Allergies Allergen Reactions   Sulfa Antibiotics Other (See Comments)    Unsure years ago    Lisinopril Swelling    Facial swelling

## 2024-12-16 NOTE — Hospital Course (Signed)
 Mark Hall is a 83 y.o. male with medical history significant for HTN, HLD, mild cognitive decline who is admitted with an open fracture of the right great toe with superimposed wound infection and subacute subdural hematoma after falling at home 1 week prior to admission.

## 2024-12-16 NOTE — Progress Notes (Signed)
 CT head reviewed. Patient fell a week ago. Came in today for an infected toe. CT head shows a right sided subacute 10mm SDH with no midline shift or mass effect. No surgical intervention necessary at this point. Recommend follow up head CT in the morning since he will be admitted for his infected toe.

## 2024-12-16 NOTE — Consult Note (Signed)
 See previous note labeled progress note for formal consult note.

## 2024-12-16 NOTE — Progress Notes (Signed)
 Pharmacy Antibiotic Note  Mark Hall is a 83 y.o. male admitted on 12/16/2024 presenting with toe pain, concern for infection.  Pharmacy has been consulted for vancomycin  dosing. Vancomycin  1g IV x 1 given in ED  Plan: Vancomycin  750 mg IV q 12h (eAUC 452) Monitor renal function, Cx and clinical progression to narrow Vancomycin  levels as indicated   Height: 5' 10 (177.8 cm) Weight: 74.8 kg (164 lb 14.5 oz) IBW/kg (Calculated) : 73  Temp (24hrs), Avg:98.2 F (36.8 C), Min:97.9 F (36.6 C), Max:98.5 F (36.9 C)  Recent Labs  Lab 12/14/24 2022 12/14/24 2027 12/16/24 1904 12/16/24 1915  WBC 9.8  --  9.3  --   CREATININE 0.94  --  0.84  --   LATICACIDVEN  --  0.9  --  0.8    Estimated Creatinine Clearance: 68.8 mL/min (by C-G formula based on SCr of 0.84 mg/dL).    Allergies[1]  Dorn Poot, PharmD, [facility] Clinical Pharmacist ED Pharmacist Phone # [phone] 12/16/2024 10:47 PM      [1]  Allergies Allergen Reactions   Sulfa Antibiotics Other (See Comments)    Unsure years ago

## 2024-12-16 NOTE — ED Provider Notes (Signed)
 Mark Hall Provider Note   CSN: [id number] Arrival date & time: 12/16/24  1721     Patient presents with: Toe Pain   Mark Hall is a 83 y.o. male.   Patient is an 83 year old male with a past medical history of hypertension and mild cognitive decline presenting to the emergency department with concern for a toe infection.  Patient reports that he fell in the bathroom about 1 week ago.  He is not exactly sure what caused him to fall.  He states that he thinks that he did hit his head but did not lose consciousness.  He states that he somehow injured his toe but does not remember how he injured it.  His daughter reports that he initially went to the ER on Monday but left without being seen due to the long wait in the waiting room.  She states that they then came this week and saw his toe and was concerned that it was infected and brought him to his primary doctors.  She states that his primary doctor recommended that he be seen in the ER.  Patient states that he has had no fevers, headache, nausea or vomiting.  He denies any other pain or injuries from the fall.  He states that his tetanus is up-to-date.  The history is provided by the patient and a relative.  Toe Pain       Prior to Admission medications  Medication Sig Start Date End Date Taking? Authorizing Provider  amoxicillin  (AMOXIL ) 500 MG capsule Take 2,000 mg by mouth See admin instructions. Take 4 capsules (2000 mg) by mouth 1 hour prior to dental appointments Patient not taking: Reported on 10/23/2024    [provider]  Cholecalciferol (VITAMIN D3) 50 MCG (2000 UT) TABS Take 2,000 Units by mouth at bedtime.    [provider]  donepezil  (ARICEPT ) 5 MG tablet Take 1 tablet (5 mg total) by mouth daily. 10/23/24   Wertman, Sara E, PA-C  lisinopril (ZESTRIL) 20 MG tablet Take 20 mg by mouth in the morning. Patient not taking: Reported on 10/23/2024    [provider]  Magnesium 250 MG TABS Take 250 mg by mouth at bedtime.    [provider]  Melatonin 10 MG TABS Take 10 mg by mouth at bedtime.    [provider]  NIFEdipine  (ADALAT  CC) 30 MG 24 hr tablet Take 30 mg by mouth daily.    [provider]  simvastatin  (ZOCOR ) 10 MG tablet Take 10 mg by mouth every evening.    [provider]    Allergies: Sulfa antibiotics    Review of Systems  Updated Vital Signs BP (!) 178/105 (BP Location: Right Arm)   Pulse 99   Temp 98.5 F (36.9 C) (Oral)   Resp 17   Ht 5' 10 (1.778 m)   Wt 74.8 kg   SpO2 100%   BMI 23.66 kg/m   Physical Exam Vitals and nursing note reviewed.  Constitutional:      General: He is not in acute distress.    Appearance: Normal appearance.  HENT:     Head: Normocephalic.     Comments: Healing appearing laceration to the forehead without surrounding erythema, warmth or drainage    Nose: Nose normal.     Mouth/Throat:     Mouth: Mucous membranes are moist.     Pharynx: Oropharynx is clear.  Eyes:     Extraocular Movements: Extraocular movements intact.  Conjunctiva/sclera: Conjunctivae normal.     Pupils: Pupils are equal, round, and reactive to light.  Neck:     Comments: No midline neck tenderness Cardiovascular:     Rate and Rhythm: Normal rate and regular rhythm.     Heart sounds: Normal heart sounds.  Pulmonary:     Effort: Pulmonary effort is normal.     Breath sounds: Normal breath sounds.  Abdominal:     General: Abdomen is flat.     Palpations: Abdomen is soft.     Tenderness: There is no abdominal tenderness.  Musculoskeletal:        General: Normal range of motion.     Cervical back: Normal range of motion and neck supple.     Comments: No bony tenderness to bilateral UE or LLE Tenderness to palpation of R 1st toe with overlying laceration over the IP joint with surrounding erythema, warmth and induration with purulent drainage   Skin:    General:  Skin is warm and dry.     Comments: Toe as above  Neurological:     General: No focal deficit present.     Mental Status: He is alert and oriented to person, place, and time.  Psychiatric:        Mood and Affect: Mood normal.        Behavior: Behavior normal.     (all labs ordered are listed, but only abnormal results are displayed) Labs Reviewed  CBC WITH DIFFERENTIAL/PLATELET - Abnormal; Notable for the following components:      Result Value   RBC 4.06 (*)    Abs Immature Granulocytes 0.12 (*)    All other components within normal limits  COMPREHENSIVE METABOLIC PANEL WITH GFR - Abnormal; Notable for the following components:   Sodium 133 (*)    Chloride 96 (*)    Glucose, Bld 108 (*)    All other components within normal limits  CULTURE, BLOOD (ROUTINE X 2)  CULTURE, BLOOD (ROUTINE X 2)  I-STAT CG4 LACTIC ACID, ED    EKG: None  Radiology: DG Toe Great Right Result Date: 12/16/2024 EXAM: 2 VIEW(S) XRAY OF THE RIGHT GREAT TOE 12/16/2024 07:44:38 PM COMPARISON: X-ray right great toe 12/14/2024. CLINICAL HISTORY: FINDINGS: BONES AND JOINTS: Acute intraarticular comminuted and displaced fracture of the distal phalangeal base and body of the great toe. Questionable internal cortical erosion of the fracture fragments on the lateral view . The fracture extends into the interphalangeal joint. There is an overlying bone fragment. Degenerative changes are present. SOFT TISSUES: Soft tissue defect along the dorsal aspect of the first digit. Soft tissue edema and emphysema are noted. Soft tissue swelling is present. IMPRESSION: 1. Open, acute, intra-articular, comminuted, and displaced fracture of the distal phalanx base and body of the right great toe. 2. Question osseous erosion of the fracture fragments that may represent superimposed infection/osteomyelitis. Recommend close attention on follow-up for developing infection. Electronically signed by: Morgane Naveau MD 12/16/2024 07:52 PM  EST RP Workstation: [id number]   CT Cervical Spine Wo Contrast Result Date: 12/16/2024 EXAM: CT CERVICAL SPINE WITHOUT CONTRAST 12/16/2024 07:27:32 PM TECHNIQUE: CT of the cervical spine was performed without the administration of intravenous contrast. Multiplanar reformatted images are provided for review. Automated exposure control, iterative reconstruction, and/or weight based adjustment of the mA/kV was utilized to reduce the radiation dose to as low as reasonably achievable. COMPARISON: None available. CLINICAL HISTORY: fall FINDINGS: BONES AND ALIGNMENT: No acute fracture or traumatic malalignment. DEGENERATIVE CHANGES: Multilevel moderate degenerative changes  of the spine. Associated multilevel at least moderate osseous neural foraminal stenosis. No severe osseous central canal stenosis. SOFT TISSUES: No prevertebral soft tissue swelling. Atherosclerotic calcific carotid arteries within the neck. IMPRESSION: 1. No evidence of acute traumatic injury. Electronically signed by: Morgane Naveau MD 12/16/2024 07:41 PM EST RP Workstation: [id number]   CT Head Wo Contrast Result Date: 12/16/2024 EXAM: CT HEAD WITHOUT CONTRAST 12/16/2024 07:27:32 PM TECHNIQUE: CT of the head was performed without the administration of intravenous contrast. Automated exposure control, iterative reconstruction, and/or weight based adjustment of the mA/kV was utilized to reduce the radiation dose to as low as reasonably achievable. COMPARISON: CT head 06/23/2024. CLINICAL HISTORY: Mental status change, unknown cause; fall week ago. FINDINGS: BRAIN AND VENTRICLES: Ventral development of a subacute 10 mm right subdural hematoma. Associated 1 mm right to left midline shift. No definite acute component. Atherosclerotic calcifications are present within the cavernous internal carotid arteries. No evidence of acute infarct. No hydrocephalus. ORBITS: Bilateral lens replacement. SINUSES: No acute abnormality. SOFT TISSUES AND SKULL: No  acute soft tissue abnormality. No skull fracture. IMPRESSION: 1. Subacute 10 mm right subdural hematoma with associated 1 mm right-to-left midline shift, without definite acute component. Electronically signed by: Morgane Naveau MD 12/16/2024 07:38 PM EST RP Workstation: [id number]     Procedures   Medications Ordered in the ED  vancomycin  (VANCOCIN ) IVPB 1000 mg/200 mL premix (1,000 mg Intravenous New Bag/Given 12/16/24 2204)  acetaminophen  (TYLENOL ) tablet 650 mg (650 mg Oral Given 12/16/24 2204)    Clinical Course as of 12/16/24 2206  Wed Dec 16, 2024  2143 I spoke with Dr. Sherida with orthopedics who recommends medicine admission and they will see.  [VK]  2201 I spoke with Luke Pean PA with neurosurgery who recommended repeat imaging in the AM. [VK]    Clinical Course User Index [VK] Kingsley, Sosie Gato K, DO                                 Medical Decision Making This patient presents to the ED with chief complaint(s) of toe injury with pertinent past medical history of HTN, mild cognitive decline which further complicates the presenting complaint. The complaint involves an extensive differential diagnosis and also carries with it a high risk of complications and morbidity.    The differential diagnosis includes cellulitis, osteomyelitis, sepsis, fracture, dislocation, ICH/mass effect, cervical spine fracture, no other traumatic injuries seen   Additional history obtained: Additional history obtained from family Records reviewed Primary Care Documents  ED Course and Reassessment: On patient's arrival he is hemodynamically stable in no acute distress.  He was initially evaluated in triage and had labs and imaging performed.  Labs showed no significant abnormality and no signs of sepsis.  His imaging did show a fracture of the first distal phalanx with concerns of osteomyelitis that does have an overlying laceration.  His head CT also did show a subacute subdural hematoma with small  amount of midline shift.  Family did report that he has had increasing forgetfulness but otherwise has no other neurologic deficits on exam and is not on any blood thinners.  Will plan to consult orthopedics and neurosurgery.  Patient will be started on antibiotics.  Independent labs interpretation:  The following labs were independently interpreted: within normal range  Independent visualization of imaging: - I independently visualized the following imaging with scope of interpretation limited to determining acute life threatening conditions related to emergency  care: CTH/C-spine, R foot XR, which revealed subacute SDH, open R distal phalax 1st toe fracture with concern of possible osteo  Consultation: - Consulted or discussed management/test interpretation w/ external professional: neurosurgery, orthopedics, hospitalist  Consideration for admission or further workup: patient requires admission for SDH and infection of open toe fracture Social Determinants of health: N/A    Risk OTC drugs. Prescription drug management. Decision regarding hospitalization.       Final diagnoses:  Open displaced fracture of distal phalanx of right great toe, initial encounter  Subacute subdural hematoma (HCC)  Cellulitis of toe of right foot    ED Discharge Orders     None          Ellouise Richerd POUR, DO 12/16/24 2205

## 2024-12-16 NOTE — ED Provider Triage Note (Signed)
 Emergency Medicine Provider Triage Evaluation Note  Mark Hall , a 83 y.o. male  was evaluated in triage.  Pt complains of right great toe injury.  Had fallen and additionally hit his head.  Not on blood thinners.  Now is concerned that it is infected.  Accompanied by family reports that he has been confused.  Left without being seen the other night for similar complaints..  Review of Systems  Positive:  Negative:   Physical Exam  BP (!) 151/75 (BP Location: Left Arm)   Pulse 97   Temp 97.9 F (36.6 C)   Resp 16   Ht 5' 10 (1.778 m)   Wt 74.8 kg   SpO2 96%   BMI 23.66 kg/m  Gen:   Awake, no distress   Resp:  Normal effort  MSK:   Moves extremities without difficulty  Other:  Right lower extremity 2+ pitting edema, warmth and erythema, right great toe is with some dorsal purulence, DP pulse 2+  Medical Decision Making  Medically screening exam initiated at 6:50 PM.  Appropriate orders placed.  Mark Hall was informed that the remainder of the evaluation will be completed by another provider, this initial triage assessment does not replace that evaluation, and the importance of remaining in the ED until their evaluation is complete.     Mark Hall DEL, PA-C 12/16/24 [phone]

## 2024-12-16 NOTE — ED Triage Notes (Addendum)
 Pt c.o right big toe pain, states it is broken s/p fall a week ago. Per chart review pt was here 2 days ago for the same. Pt ambulatory to ED.  Pts daughter arrived and came to sort desk stating the pt did not give all information to me. Pts daughter showed me a picture of his toe that looks very infected and inflamed.

## 2024-12-16 NOTE — H&P (Signed)
 History and Physical    Mark Hall:[id number] DOB: 1941/07/29 DOA: 12/16/2024  PCP: Marvene [name], [name]  Patient coming from: Home  I have personally briefly reviewed patient's old medical records in [facility]  Chief Complaint: Right great toe pain  HPI: Mark Hall is a 83 y.o. male with medical history significant for HTN, HLD, mild cognitive decline who presented to the ED for evaluation of an injury to his right great toe.  Patient states that he fell in his bathroom about 1 week ago.  He says he stubbed his right great toe which he thought might of caused his fall.  He did hit his forehead during the fall.  He denies any loss of consciousness or syncopal event.  Since his fall he has had worsening pain to his right first toe which is now appearing infected.  He has not had any recent lightheadedness, dizziness, chest pain, dyspnea, nausea, vomiting, diarrhea, or focal weakness.  He does not take any blood thinners.  ED Course  Labs/Imaging on admission: I have personally reviewed following labs and imaging studies.  Initial vitals showed BP 151/75, pulse 97, RR 16, temp 97.9 F, SpO2 96% on room air.  Labs showed WBC 9.3, hemoglobin 13.2, platelets 351, sodium 133, potassium 4.0, bicarb 28, BUN 14, creatinine 0.84, serum glucose 108, LFTs within normal limits, lactic acid 0.8.  Blood cultures pending.  CT head without contrast showed a subacute 10 mm right subdural hematoma with associated 1 mm right-to-left midline shift without definite acute component.  CT cervical spine without contrast negative for acute traumatic injury.  Right great toe x-ray showed open acute intra-articular comminuted displaced fracture of the distal phalanx base and body of the right great toe.  Question of osseous erosion of the fracture fragments that may represent superimposed infection/osteomyelitis also noted.  Patient was given IV vancomycin .  EDP spoke with orthopedic surgery (Dr.  Sherida) who recommended cold admission and he will see in consultation.  EDP also spoke with neurosurgery Lenis Pean, NP) who recommended follow-up head CT in the morning, no surgical intervention necessary at this point.  The hospitalist service was consulted for admission.  Review of Systems: All systems reviewed and are negative except as documented in history of present illness above.   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 No pertinent family history.   Prior to Admission medications  Medication Sig Start Date End Date Taking? Authorizing Provider  amoxicillin  (AMOXIL ) 500 MG capsule Take 2,000 mg by mouth See admin instructions. Take 4 capsules (2000 mg) by mouth 1 hour prior to dental appointments Patient not taking: Reported on 10/23/2024    [provider]  Cholecalciferol (VITAMIN D3) 50 MCG (2000 UT) TABS Take 2,000 Units by mouth at bedtime.    [provider]  donepezil  (ARICEPT ) 5 MG  tablet Take 1 tablet (5 mg total) by mouth daily. 10/23/24   Wertman, Sara E, PA-C  lisinopril (ZESTRIL) 20 MG tablet Take 20 mg by mouth in the morning. Patient not taking: Reported on 10/23/2024    [provider]  Magnesium 250 MG TABS Take 250 mg by mouth at bedtime.    [provider]  Melatonin 10 MG TABS Take 10 mg by mouth at bedtime.    [provider]  NIFEdipine  (ADALAT  CC) 30 MG 24 hr tablet Take 30 mg by mouth daily.    [provider]  simvastatin  (ZOCOR ) 10 MG tablet Take 10 mg by mouth every evening.    [provider]    Physical Exam: Vitals:   12/16/24 1725 12/16/24 1841 12/16/24 2201  BP: (!) 151/75  (!) 178/105  Pulse: 97  99  Resp: 16  17  Temp: 97.9 F (36.6 C)  98.5 F (36.9 C)  TempSrc:   Oral  SpO2: 96%  100%  Weight:  74.8 kg   Height:  5' 10 (1.778 m)    Constitutional: Resting in bed, NAD, calm, comfortable Head: Healing laceration to right forehead without fluctuance or drainage Eyes: PERRL, EOMI, lids and conjunctivae normal ENMT: Mucous membranes are moist. Posterior pharynx clear of any exudate or lesions.Normal dentition.  Neck: normal, supple, no masses. Respiratory: clear to auscultation bilaterally, no wheezing, no crackles. Normal respiratory effort. No accessory muscle use.  Cardiovascular: Regular rate and rhythm, systolic murmur. No extremity edema. 2+ pedal pulses. Abdomen: no tenderness, no masses palpated. Musculoskeletal: Deviated right first toe with tenderness to palpation, overlying laceration at the IP joint with surrounding erythema increased warmth to touch Skin: Laceration right great toe with erythema, increased warmth to touch as pictured below Neurologic: Sensation intact. Strength 5/5 in all 4.  Psychiatric: Normal judgment and insight. Alert and oriented x 3. Normal mood.    EKG: Not performed.  Assessment/Plan Principal Problem:   Open displaced fracture of distal phalanx of right great toe, initial encounter Active Problems:   Subacute subdural hematoma (HCC)   Mild cognitive impairment   Hypertension   Mark Hall is a 83 y.o. male with medical history significant for HTN, HLD, mild cognitive decline who is admitted with an open fracture of the right great toe with superimposed wound infection and subacute subdural hematoma after  falling at home 1 week prior to admission.  Assessment and Plan: Right first toe open fracture with superimposed wound infection: - Orthopedics to consult - Will keep n.p.o. after midnight - Continue IV vancomycin  and ceftriaxone   Subacute subdural hematoma: Recurrent after fall with head injury approximately 1 week prior to admission.  CT head shows a subacute 10 mm right subdural hematoma with associated 1 mm right-to-left midline shift, no definite acute component.  Neurosurgery team have recommended repeat CT head in the morning, otherwise no surgical intervention indicated at this time. - Repeat CT head ordered for a.m.  Hypertension: Continue nifedipine .  Hyperlipidemia: Continue simvastatin .  Mild cognitive impairment: Continue donepezil .   DVT prophylaxis: SCDs Start: 12/16/24 2242 Code Status: Full code, confirmed with patient on admission Family Communication: Daughter at bedside Disposition Plan: From home, dispo pending clinical progress Consults called: Orthopedics, neurosurgery Severity of Illness: The appropriate patient status for this patient is INPATIENT. Inpatient status is judged to be reasonable and necessary in order to provide the required intensity of service to ensure the patient's safety. The patient's presenting symptoms, physical exam findings, and initial radiographic  and laboratory data in the context of their chronic comorbidities is felt to place them at high risk for further clinical deterioration. Furthermore, it is not anticipated that the patient will be medically stable for discharge from the hospital within 2 midnights of admission.   * I certify that at the point of admission it is my clinical judgment that the patient will require inpatient hospital care spanning beyond 2 midnights from the point of admission due to high intensity of service, high risk for further deterioration and high frequency of surveillance required.DEWAINE Jorie Blanch MD [facility]  If 7PM-7AM, please contact night-coverage [email or web address]  12/16/2024, 10:58 PM      [1]  Social History Tobacco Use   Smoking status: Never   Smokeless tobacco: Never  Vaping Use   Vaping status: Never Used  Substance Use Topics   Alcohol use: Yes    Comment: occ   Drug use: Never  [2]  Allergies Allergen Reactions   Sulfa Antibiotics Other (See Comments)    Unsure years ago    Lisinopril Swelling    Facial swelling

## 2024-12-17 ENCOUNTER — Inpatient Hospital Stay (HOSPITAL_COMMUNITY)

## 2024-12-17 DIAGNOSIS — S065XAA Traumatic subdural hemorrhage with loss of consciousness status unknown, initial encounter: Secondary | ICD-10-CM | POA: Diagnosis present

## 2024-12-17 DIAGNOSIS — S92421B Displaced fracture of distal phalanx of right great toe, initial encounter for open fracture: Secondary | ICD-10-CM | POA: Diagnosis not present

## 2024-12-17 LAB — BASIC METABOLIC PANEL WITH GFR
Anion gap: 9 (ref 5–15)
BUN: 12 mg/dL (ref 8–23)
CO2: 27 mmol/L (ref 22–32)
Calcium: 9.1 mg/dL (ref 8.9–10.3)
Chloride: 97 mmol/L — ABNORMAL LOW (ref 98–111)
Creatinine, Ser: 0.79 mg/dL (ref 0.61–1.24)
GFR, Estimated: >60 mL/min (ref >60)
Glucose, Bld: 100 mg/dL — ABNORMAL HIGH (ref 70–99)
Potassium: 3.8 mmol/L (ref 3.5–5.1)
Sodium: 132 mmol/L — ABNORMAL LOW (ref 135–145)

## 2024-12-17 LAB — CBC
HCT: 35.2 % — ABNORMAL LOW (ref 39.0–52.0)
Hemoglobin: 11.6 g/dL — ABNORMAL LOW (ref 13.0–17.0)
MCH: 32.6 pg (ref 26.0–34.0)
MCHC: 33 g/dL (ref 30.0–36.0)
MCV: 98.9 fL (ref 80.0–100.0)
Platelets: 322 K/uL (ref 150–400)
RBC: 3.56 MIL/uL — ABNORMAL LOW (ref 4.22–5.81)
RDW: 12.4 % (ref 11.5–15.5)
WBC: 8.7 K/uL (ref 4.0–10.5)
nRBC: 0 % (ref 0.0–0.2)

## 2024-12-17 MED ORDER — AMOXICILLIN-POT CLAVULANATE 875-125 MG PO TABS: 1.0000 | ORAL_TABLET | Freq: Two times a day (BID) | ORAL | Status: DC

## 2024-12-17 MED ORDER — VITAMIN B-12 1000 MCG PO TABS
500.0000 ug | ORAL_TABLET | Freq: Every morning | ORAL | Status: DC
  Administered 2024-12-17: 10:00:00 500 ug via ORAL
  Filled 2024-12-17: qty 1

## 2024-12-17 MED ORDER — AMOXICILLIN-POT CLAVULANATE 875-125 MG PO TABS: 1.0000 | ORAL_TABLET | Freq: Two times a day (BID) | ORAL | 0 refills | Status: AC

## 2024-12-17 MED ORDER — DOXYCYCLINE HYCLATE 100 MG PO TABS: 100.0000 mg | ORAL_TABLET | Freq: Two times a day (BID) | ORAL | Status: DC

## 2024-12-17 MED ORDER — LIDOCAINE HCL (PF) 1 % IJ SOLN
INTRAMUSCULAR | Status: AC
  Filled 2024-12-17: qty 5

## 2024-12-17 MED ORDER — ACETAMINOPHEN 325 MG PO TABS: 650.0000 mg | ORAL_TABLET | Freq: Once | ORAL | Status: DC

## 2024-12-17 MED ORDER — OXYCODONE HCL 5 MG PO TABS: 5.0000 mg | ORAL_TABLET | ORAL | 0 refills | Status: AC | PRN

## 2024-12-17 MED ORDER — DOXYCYCLINE HYCLATE 100 MG PO TABS: 100.0000 mg | ORAL_TABLET | Freq: Two times a day (BID) | ORAL | 0 refills | Status: AC

## 2024-12-17 MED ORDER — ACETAMINOPHEN 325 MG PO TABS: 650.0000 mg | ORAL_TABLET | Freq: Four times a day (QID) | ORAL | Status: AC | PRN

## 2024-12-17 NOTE — Progress Notes (Signed)
°  NEUROSURGERY PROGRESS NOTE   I reviewed the EMR including notes regarding patient's current admission and personally reviewed recent CT head (x2) and previous MRI from August. Pt apparently fell about a week ago but presented last night with infected great toe. CT scans overnight have demonstrated stable appearance of ~67mm right convexity hypodense subacute SDH without local mass effect or MLS (due to significant brain atrophy). No SDH seen on prior MRI in August. Pt appears completely asymptomatic from this. It appears the patient certainly does not need operative evacuation at this time. While MMA embolization remains an option, as this is the initial diagnosis likely related to fall last week and it remains asymptomatic, I would like to see if this SDH resolves spontaneously. I would therefore recommend repeat non-contrast CTH in about 3-4 weeks with outpatient follow-up.  Mark Maizes, MD [facility]

## 2024-12-17 NOTE — Progress Notes (Signed)
° °  Brief Progress Note   _____________________________________________________________________________________________________________  Patient Name: Mark Hall Patient DOB: 03-20-1941 Date: @TODAY @      Data: Reviewed vital signs, labs, and clinical notes.    Action: No action required at this time.    Response:  Pt downgraded to Med surg. Neurosurg and Ortho consulted.  _____________________________________________________________________________________________________________  [facility] RN [facility] Ardelia Wrede S Merrily Tegeler Please contact us  directly via secure chat (search for [facility]) or by calling us  at [phone] [facility]).

## 2024-12-17 NOTE — Consult Note (Signed)
 Reason for Consult: Right foot injury Referring Physician: Emergency department  Mark Hall is an 83 y.o. male.  HPI: Mark Hall is an 83 year old male who presented to the emergency department after discovering a wound on the dorsal aspect of his hallux.  He is here with family and states that he fell in the shower about a week ago but was unaware that he had injured his foot.  He also had some head trauma.  Imaging of the foot in the emergency department revealed a distal phalanx fracture and given the wound on the dorsal aspect of the foot it was concerning for an open fracture that was subacute and not addressed initially.  He also was found to have intracranial hemorrhage and neurosurgery was consulted.  Orthopedics was consulted to evaluate the foot.  On my evaluation the patient states he does not have any pain in his foot.  He notes the wound.  He has had no formal treatment.  Denies fevers or chills.  Past Medical History:  Diagnosis Date   Arthritis    Cancer (HCC)    skin cancer   Hypertension    Pneumonia     Past Surgical History:  Procedure Laterality Date   CHOLECYSTECTOMY     INGUINAL HERNIA REPAIR Left 02/28/2024   Procedure: OPEN LEFT INGUINAL HERNIA REPAIR WITH MESH;  Surgeon: Signe Mitzie LABOR, MD;  Location: [facility];  Service: General;  Laterality: Left;   KNEE ARTHROPLASTY Right    TOTAL KNEE ARTHROPLASTY Left 05/21/2022   Procedure: TOTAL KNEE ARTHROPLASTY;  Surgeon: Melodi Lerner, MD;  Location: [facility];  Service: Orthopedics;  Laterality: Left;    History reviewed. No pertinent family history.  Social History:  reports that he has never smoked. He has never used smokeless tobacco. He reports current alcohol use. He reports that he does not use drugs.  Allergies: Allergies[1]  Medications: I have reviewed the patient's current medications.  Results for orders placed or performed during the hospital encounter of 12/16/24 (from the past 48 hours)  CBC with  Differential     Status: Abnormal   Collection Time: 12/16/24  7:04 PM  Result Value Ref Range   WBC 9.3 4.0 - 10.5 K/uL   RBC 4.06 (L) 4.22 - 5.81 MIL/uL   Hemoglobin 13.2 13.0 - 17.0 g/dL   HCT 59.3 60.9 - 47.9 %   MCV 100.0 80.0 - 100.0 fL   MCH 32.5 26.0 - 34.0 pg   MCHC 32.5 30.0 - 36.0 g/dL   RDW 87.3 88.4 - 84.4 %   Platelets 351 150 - 400 K/uL   nRBC 0.0 0.0 - 0.2 %   Neutrophils Relative % 66 %   Neutro Abs 6.1 1.7 - 7.7 K/uL   Lymphocytes Relative 16 %   Lymphs Abs 1.5 0.7 - 4.0 K/uL   Monocytes Relative 11 %   Monocytes Absolute 1.0 0.1 - 1.0 K/uL   Eosinophils Relative 5 %   Eosinophils Absolute 0.4 0.0 - 0.5 K/uL   Basophils Relative 1 %   Basophils Absolute 0.1 0.0 - 0.1 K/uL   Immature Granulocytes 1 %   Abs Immature Granulocytes 0.12 (H) 0.00 - 0.07 K/uL    Comment: Performed at [facility], [address] [place], [place] [address]  Comprehensive metabolic panel     Status: Abnormal   Collection Time: 12/16/24  7:04 PM  Result Value Ref Range   Sodium 133 (L) 135 - 145 mmol/L   Potassium 4.0 3.5 -  5.1 mmol/L   Chloride 96 (L) 98 - 111 mmol/L   CO2 28 22 - 32 mmol/L   Glucose, Bld 108 (H) 70 - 99 mg/dL    Comment: Glucose reference range applies only to samples taken after fasting for at least 8 hours.   BUN 14 8 - 23 mg/dL   Creatinine, Ser 9.15 0.61 - 1.24 mg/dL   Calcium 9.2 8.9 - 89.6 mg/dL   Total Protein 7.3 6.5 - 8.1 g/dL   Albumin 4.0 3.5 - 5.0 g/dL   AST 28 15 - 41 U/L   ALT 16 0 - 44 U/L   Alkaline Phosphatase 75 38 - 126 U/L   Total Bilirubin 0.4 0.0 - 1.2 mg/dL   GFR, Estimated >39 >39 mL/min    Comment: (NOTE) Calculated using the CKD-EPI Creatinine Equation (2021)    Anion gap 10 5 - 15    Comment: Performed at [facility], [address], [place], [place] [address]  Blood culture (routine x 2)     Status: None (Preliminary result)   Collection Time: 12/16/24  7:04 PM   Specimen: BLOOD  Result Value Ref Range    Specimen Description BLOOD SITE NOT SPECIFIED    Special Requests      BOTTLES DRAWN AEROBIC AND ANAEROBIC Blood Culture adequate volume   Culture      NO GROWTH < 12 HOURS Performed at [facility], [address] [place], [place] [address]    Report Status PENDING   I-Stat CG4 Lactic Acid     Status: None   Collection Time: 12/16/24  7:15 PM  Result Value Ref Range   Lactic Acid, Venous 0.8 0.5 - 1.9 mmol/L  Blood culture (routine x 2)     Status: None (Preliminary result)   Collection Time: 12/16/24 10:24 PM   Specimen: BLOOD  Result Value Ref Range   Specimen Description BLOOD SITE NOT SPECIFIED    Special Requests      BOTTLES DRAWN AEROBIC AND ANAEROBIC Blood Culture results may not be optimal due to an inadequate volume of blood received in culture bottles   Culture      NO GROWTH < 12 HOURS Performed at [facility], [address], [place], [place] [address]    Report Status PENDING   CBC     Status: Abnormal   Collection Time: 12/17/24 12:32 AM  Result Value Ref Range   WBC 8.7 4.0 - 10.5 K/uL   RBC 3.56 (L) 4.22 - 5.81 MIL/uL   Hemoglobin 11.6 (L) 13.0 - 17.0 g/dL   HCT 64.7 (L) 60.9 - 47.9 %   MCV 98.9 80.0 - 100.0 fL   MCH 32.6 26.0 - 34.0 pg   MCHC 33.0 30.0 - 36.0 g/dL   RDW 87.5 88.4 - 84.4 %   Platelets 322 150 - 400 K/uL   nRBC 0.0 0.0 - 0.2 %    Comment: Performed at [facility], [address] [place], [place] [address]  Basic metabolic panel     Status: Abnormal   Collection Time: 12/17/24 12:32 AM  Result Value Ref Range   Sodium 132 (L) 135 - 145 mmol/L   Potassium 3.8 3.5 - 5.1 mmol/L   Chloride 97 (L) 98 - 111 mmol/L   CO2 27 22 - 32 mmol/L   Glucose, Bld 100 (H) 70 - 99 mg/dL    Comment: Glucose reference range applies only to samples taken after fasting for  at least 8 hours.   BUN 12 8 - 23 mg/dL   Creatinine, Ser 9.20 0.61 - 1.24 mg/dL   Calcium 9.1 8.9 - 89.6 mg/dL   GFR, Estimated >39 >39 mL/min    Comment:  (NOTE) Calculated using the CKD-EPI Creatinine Equation (2021)    Anion gap 9 5 - 15    Comment: Performed at [facility], [address] [place], [place] [address]    CT HEAD WO CONTRAST ( ) Result Date: 12/17/2024 EXAM: CT HEAD WITHOUT CONTRAST 12/17/2024 07:12:00 AM TECHNIQUE: CT of the head was performed without the administration of intravenous contrast. Automated exposure control, iterative reconstruction, and/or weight based adjustment of the mA/kV was utilized to reduce the radiation dose to as low as reasonably achievable. COMPARISON: 12/16/2024 CLINICAL HISTORY: Subdural hematoma FINDINGS: BRAIN AND VENTRICLES: Unchanged subacute subdural hematoma overlying the right cerebral convexity, measuring up to 8 mm in thickness. Minimal 1 mm leftward midline shift, unchanged. No evidence of acute infarct. No hydrocephalus. ORBITS: No acute abnormality. SINUSES: No acute abnormality. SOFT TISSUES AND SKULL: No acute soft tissue abnormality. No skull fracture. IMPRESSION: 1. Unchanged subacute subdural hematoma overlying the right cerebral convexity, measuring up to 8 mm in thickness. 2. Minimal 1 mm leftward midline shift, unchanged. Electronically signed by: Evalene Coho MD 12/17/2024 07:24 AM EST RP Workstation: [id number]   DG Toe Great Right Result Date: 12/16/2024 EXAM: 2 VIEW(S) XRAY OF THE RIGHT GREAT TOE 12/16/2024 07:44:38 PM COMPARISON: X-ray right great toe 12/14/2024. CLINICAL HISTORY: FINDINGS: BONES AND JOINTS: Acute intraarticular comminuted and displaced fracture of the distal phalangeal base and body of the great toe. Questionable internal cortical erosion of the fracture fragments on the lateral view . The fracture extends into the interphalangeal joint. There is an overlying bone fragment. Degenerative changes are present. SOFT TISSUES: Soft tissue defect along the dorsal aspect of the first digit. Soft tissue edema and emphysema are noted. Soft tissue swelling is  present. IMPRESSION: 1. Open, acute, intra-articular, comminuted, and displaced fracture of the distal phalanx base and body of the right great toe. 2. Question osseous erosion of the fracture fragments that may represent superimposed infection/osteomyelitis. Recommend close attention on follow-up for developing infection. Electronically signed by: Morgane Naveau MD 12/16/2024 07:52 PM EST RP Workstation: [id number]   CT Cervical Spine Wo Contrast Result Date: 12/16/2024 EXAM: CT CERVICAL SPINE WITHOUT CONTRAST 12/16/2024 07:27:32 PM TECHNIQUE: CT of the cervical spine was performed without the administration of intravenous contrast. Multiplanar reformatted images are provided for review. Automated exposure control, iterative reconstruction, and/or weight based adjustment of the mA/kV was utilized to reduce the radiation dose to as low as reasonably achievable. COMPARISON: None available. CLINICAL HISTORY: fall FINDINGS: BONES AND ALIGNMENT: No acute fracture or traumatic malalignment. DEGENERATIVE CHANGES: Multilevel moderate degenerative changes of the spine. Associated multilevel at least moderate osseous neural foraminal stenosis. No severe osseous central canal stenosis. SOFT TISSUES: No prevertebral soft tissue swelling. Atherosclerotic calcific carotid arteries within the neck. IMPRESSION: 1. No evidence of acute traumatic injury. Electronically signed by: Morgane Naveau MD 12/16/2024 07:41 PM EST RP Workstation: [id number]   CT Head Wo Contrast Result Date: 12/16/2024 EXAM: CT HEAD WITHOUT CONTRAST 12/16/2024 07:27:32 PM TECHNIQUE: CT of the head was performed without the administration of intravenous contrast. Automated exposure control, iterative reconstruction, and/or weight based adjustment of the mA/kV was utilized to reduce the radiation dose to as low as reasonably achievable. COMPARISON: CT head 06/23/2024. CLINICAL HISTORY: Mental status change, unknown cause; fall week  ago. FINDINGS: BRAIN  AND VENTRICLES: Ventral development of a subacute 10 mm right subdural hematoma. Associated 1 mm right to left midline shift. No definite acute component. Atherosclerotic calcifications are present within the cavernous internal carotid arteries. No evidence of acute infarct. No hydrocephalus. ORBITS: Bilateral lens replacement. SINUSES: No acute abnormality. SOFT TISSUES AND SKULL: No acute soft tissue abnormality. No skull fracture. IMPRESSION: 1. Subacute 10 mm right subdural hematoma with associated 1 mm right-to-left midline shift, without definite acute component. Electronically signed by: Morgane Naveau MD 12/16/2024 07:38 PM EST RP Workstation: [id number]    Review of Systems  Constitutional: Negative.   HENT: Negative.    Eyes: Negative.   Respiratory: Negative.    Gastrointestinal: Negative.   Musculoskeletal:        Right foot pain  Neurological:  Positive for headaches.  Psychiatric/Behavioral: Negative.     Blood pressure 139/75, pulse 87, temperature 98.2 F (36.8 C), temperature source Oral, resp. rate 16, height 5' 10 (1.778 m), weight 74.8 kg, SpO2 98%. Physical Exam HENT:     Head:     Comments: Band aid on forehead    Mouth/Throat:     Mouth: Mucous membranes are moist.  Eyes:     Extraocular Movements: Extraocular movements intact.  Cardiovascular:     Rate and Rhythm: Normal rate.     Pulses: Normal pulses.  Pulmonary:     Effort: Pulmonary effort is normal.  Abdominal:     Palpations: Abdomen is soft.  Musculoskeletal:     Cervical back: Neck supple.     Comments: Right foot demonstrates a 3 to 4 cm transverse soft tissue wound along the dorsal aspect of the hallux.  This appears subacute.  The hallux is drooping at the distal aspect of the toe just proximal to the nail.  Tender to palpation with surrounding redness.  Foot is swollen.  No other area of notable injury.  Skin:    General: Skin is warm.     Findings: Lesion present.  Neurological:     General:  No focal deficit present.     Mental Status: He is alert.  Psychiatric:        Mood and Affect: Mood normal.     Assessment/Plan: The patient has an open distal phalanx fracture of the hallux that is subacute.  Initial injury was over a week ago and was not addressed.  Given this complex problem and the need for rapid decision making it is my decision to proceed with urgent treatment of the fracture and washout of the open fracture.  He will need closure of the wound as well.  We did discuss at length the risk of infection in the setting of an open fracture that was not addressed initially.  He voiced understanding.  Currently does not have any systemic symptoms consistent with infection however there is some redness and swelling around the toe which may represent early cellulitis however could also represent trauma.  Given the subacute nature of the injury I do feel like urgent surgery is required in this case.  I discussed this with the patient and he would like to proceed.  We discussed the risk, benefits and alternatives to surgery which include but are not limited to wound healing complications, infection, need for further surgery, nonunion, malunion and other complications related to open fractures.  Mark Hall 12/17/2024, 4:03 PM         [1]  Allergies Allergen Reactions   Sulfa Antibiotics Other (See  Comments)    Unsure years ago    Lisinopril Swelling    Facial swelling

## 2024-12-17 NOTE — Evaluation (Signed)
 Physical Therapy Evaluation Patient Details Name: Mark Hall MRN: [id number] DOB: [birth year] Today's Date: 12/17/2024  History of Present Illness  Pt is 83 yo presenting [facility] on [facility]/17 with reports of R big toe pain/infection. Family reports pt fell and hit head a week ago. Pt was experiencing some headaches but none now. Pt with stable SDH per MD notes. PMH: Arthritis, cancer, HTN, pneumonia, L TKA  Clinical Impression  Pt is currently presenting at Ind for bed mobility, Mod I for sit to stand with and without RW and Mod I to supervision for gait. Pt gait improves significantly with RW and will benefit in order to decrease fall risk from RW at home. Pt has good family support. Pt was provided with education on mild TBI/concussion including signs/symptoms and the importance of rest/decreased stimulation; as well as educated on the importance of notifying MD is symptoms do not resolve in 2-3 weeks or increase. Currently pt is presenting at  Mod I level of functioning and expected to make good progress.  No skilled physical therapy services recommended. Pt will be discharged from skilled physical therapy services at this time; please re-consult if further needs arise.            If plan is discharge home, recommend the following: Other (comment) (as needed assistance)     Equipment Recommendations None recommended by PT     Functional Status Assessment Patient has had a recent decline in their functional status and demonstrates the ability to make significant improvements in function in a reasonable and predictable amount of time.     Precautions / Restrictions Precautions Precautions: None Recall of Precautions/Restrictions: Intact Required Braces or Orthoses: Other Brace Other Brace: post op shoe Restrictions Weight Bearing Restrictions Per Provider Order: No Other Position/Activity Restrictions: wear post op shoe      Mobility  Bed Mobility Overal bed mobility: Independent         Transfers Overall transfer level: Modified independent Equipment used: None, Rolling walker (2 wheels)               General transfer comment: Pt able to stand with and without AD at Mod I; slight increase in BOS    Ambulation/Gait Ambulation/Gait assistance: Modified independent (Device/Increase time), Supervision Gait Distance (Feet): 50 Feet Assistive device: Rolling walker (2 wheels), None Gait Pattern/deviations: Step-through pattern, Antalgic Gait velocity: mildly decreased Gait velocity interpretation: >2.62 ft/sec, indicative of community ambulatory   General Gait Details: supervision without AD, increased antalgic gait pattern. Improved  with RW  Stairs Stairs:  (Demonstrates good strength with sit to stand and gait in order to perform step up onto threshold per home set up)            Balance Overall balance assessment: Modified Independent       Pertinent Vitals/Pain Pain Assessment Pain Assessment: 0-10 Pain Score: 5  Pain Location: R great toe Pain Descriptors / Indicators: Aching, Throbbing Pain Intervention(s): Limited activity within patient's tolerance, Monitored during session    Home Living Family/patient expects to be discharged to:: Private residence Living Arrangements: Alone Available Help at Discharge: Family;Available PRN/intermittently (3 children who can assist and seniors helping seniors) Type of Home: House Home Access: Stairs to enter Entrance Stairs-Rails: None Entrance Stairs-Number of Steps: 1   Home Layout: One level Home Equipment: None      Prior Function Prior Level of Function : Independent/Modified Independent;Driving;History of Falls (last six months)  Mobility Comments: normally ind; no need for assistive device. 1 fall in 6 months ADLs Comments: ind     Extremity/Trunk Assessment   Upper Extremity Assessment Upper Extremity Assessment: Overall WFL for tasks assessed    Lower Extremity  Assessment Lower Extremity Assessment: Overall WFL for tasks assessed    Cervical / Trunk Assessment Cervical / Trunk Assessment: Normal  Communication   Communication Communication: No apparent difficulties    Cognition Arousal: Alert Behavior During Therapy: WFL for tasks assessed/performed   PT - Cognitive impairments: No apparent impairments   Following commands: Intact             General Comments General comments (skin integrity, edema, etc.): daughter present during session        Assessment/Plan    PT Assessment Patient does not need any further PT services         PT Goals (Current goals can be found in the Care Plan section)  Acute Rehab PT Goals PT Goal Formulation: All assessment and education complete, DC therapy     AM-PAC PT 6 Clicks Mobility  Outcome Measure Help needed turning from your back to your side while in a flat bed without using bedrails?: None Help needed moving from lying on your back to sitting on the side of a flat bed without using bedrails?: None Help needed moving to and from a bed to a chair (including a wheelchair)?: None Help needed standing up from a chair using your arms (e.g., wheelchair or bedside chair)?: None Help needed to walk in hospital room?: None Help needed climbing 3-5 steps with a railing? : A Little 6 Click Score: 23    End of Session Equipment Utilized During Treatment: Gait belt Activity Tolerance: Patient tolerated treatment well Patient left: in bed;with family/visitor present (in ED no call bell) Nurse Communication: Mobility status      Time: 8662-8644 PT Time Calculation (min) (ACUTE ONLY): 18 min   Charges:   PT Evaluation $PT Eval Low Complexity: 1 Low   PT General Charges $$ ACUTE PT VISIT: 1 Visit         Dorothyann Maier, DPT, CLT  Acute Rehabilitation Services Office: [phone] (Secure chat preferred)   Dorothyann VEAR Maier 12/17/2024, 2:28 PM

## 2024-12-17 NOTE — Progress Notes (Signed)
 OT Cancellation Note  Patient Details Name: Mark Hall MRN: [id number] DOB: 05/22/1941   Cancelled Treatment:    Reason Eval/Treat Not Completed: PT screened, no needs identified, will sign off.  Per discussion with PT pt is at baseline function for transfers, mobility, and ADLs per report.  Will defer OT eval at this time.  Haim Hansson OTR/L  12/17/2024, 1:55 PM

## 2024-12-17 NOTE — Discharge Summary (Signed)
 DISCHARGE SUMMARY  Mark Hall  MR#: [id number]  DOB:Mar 03, 1941  Date of Admission: 12/16/2024 Date of Discharge: 12/17/2024  Attending Physician:Kizer Nobbe [name], MD  Patient's ERE:Mark Hall, Mark SAUNDERS, [name]  Disposition: D/C home  Follow-up Appts:  Follow-up Information     Mark Lonni SAUNDERS, MD Follow up.   Specialty: Orthopedic Surgery Why: The office will call you with an appointment to be seen next week. If you do not hear from the office in 2-3 days please call this number to arrange follow up. Contact information: [address] [place] [address] [phone]         Mark Pupa, MD Follow up.   Specialty: Neurosurgery Why: The office will call you to arrange for a visit in 3-4 weeks. If you do not receive a call from the office in 2-3 days please call this number to arrange an office visit with a repeat CT of the head. Contact information: [address] [place] [address] [phone]                 Tests Needing Follow-up: -assess R great toe wound -assure pt has f/u with Neurosurgery for re-evaluation of his SDH (due for a repeat CT head in 3-4 weeks)   Discharge Diagnoses: Right great toe open fracture with superimposed wound infection Subacute subdural hematoma status post mechanical fall HTN HLD Mild cognitive impairment B12 deficiency  Initial presentation: 83 year old with a history of HTN, HLD, and mild cognitive decline who presented to the ER 12/17 with an injury to his right great toe. He reported that he fell in his bathroom 1 week prior at which time he stubbed his right great toe and also struck his forehead. He denied loss of consciousness or syncope. Since his fall he has had progressively worsening pain of the right great toe with overlying erythema. In the ER CT head revealed a subacute 10 mm right subdural hematoma with 1 mm right-to-left midline shift. CT cervical spine was without acute injury  evident. X-rays of the right great toe revealed an open acute intra-articular comminuted displaced fracture of the distal phalanx base and body of the right great toe with possible osseous erosion of the fracture fragments. Orthopedics and Neurosurgery were consulted in the ER.   Hospital Course:  Right great toe open fracture with superimposed wound infection continue empiric antibiotic therapy with patient receiving IV antibiotics while in the ER then transitioned to oral Augmentin  for broad empiric coverage as well as oral doxycycline  to cover the possibility of MRSA - bedside I&D performed in ER 12/18 per Dr. Elsa with wound dressed and dry at time of discharge -patient to follow-up with Dr. Elsa in the office next week for follow-up of wound -patient is to bear weight as tolerated on his right heel but to avoid weight on the right great toe, and was provided with a postop shoe prior to discharge   Subacute subdural hematoma status post mechanical fall 10 mm right subdural hematoma with 1 mm right-to-left midline shift appreciated presentation -Neurosurgery evaluated-repeat CT head 12/18 confirmed stability of SDH -no acute intervention necessary during admission -patient to follow-up with Dr. Lanis in 3-4 weeks for repeat CT head   HTN Continue his usual nifedipine  - blood pressure controlled   HLD Continue his usual simvastatin    Mild cognitive impairment Continue usual donepezil  dose   Recent B12 deficiency Noted on July 14, 2024 -recheck in outpatient follow-up  Allergies as of 12/17/2024  Reactions   Sulfa Antibiotics Other (See Comments)   Unsure years ago   Lisinopril Swelling   Facial swelling        Medication List     TAKE these medications    acetaminophen  325 MG tablet Commonly known as: TYLENOL  Take 2 tablets (650 mg total) by mouth every 6 (six) hours as needed for mild pain (pain score 1-3) or fever (or Fever >/= 101).   amoxicillin  500 MG  capsule Commonly known as: AMOXIL  Take 2,000 mg by mouth See admin instructions. Take 4 capsules (2000 mg) by mouth 1 hour prior to dental appointments   amoxicillin -clavulanate 875-125 MG tablet Commonly known as: AUGMENTIN  Take 1 tablet by mouth every 12 (twelve) hours for 10 days.   donepezil  5 MG tablet Commonly known as: ARICEPT  Take 1 tablet (5 mg total) by mouth daily.   doxycycline  100 MG tablet Commonly known as: VIBRA -TABS Take 1 tablet (100 mg total) by mouth every 12 (twelve) hours for 10 days.   Magnesium 250 MG Tabs Take 250 mg by mouth at bedtime.   Melatonin 10 MG Tabs Take 10 mg by mouth at bedtime.   NIFEdipine  30 MG 24 hr tablet Commonly known as: ADALAT  CC Take 30 mg by mouth daily.   omeprazole 40 MG capsule Commonly known as: PRILOSEC Take 40 mg by mouth in the morning.   oxyCODONE  5 MG immediate release tablet Commonly known as: Oxy IR/ROXICODONE  Take 1 tablet (5 mg total) by mouth every 4 (four) hours as needed for moderate pain (pain score 4-6).   simvastatin  10 MG tablet Commonly known as: ZOCOR  Take 10 mg by mouth every evening.   VITAMIN B12 PO Take 1 tablet by mouth in the morning.   Vitamin D3 50 MCG (2000 UT) Tabs Take 2,000 Units by mouth in the morning.        Day of Discharge BP 134/66   Pulse 91   Temp 98.2 F (36.8 C) (Oral)   Resp (!) 22   Ht 5' 10 (1.778 m)   Wt 74.8 kg   SpO2 96%   BMI 23.66 kg/m   Physical Exam: General: No acute respiratory distress Lungs: Clear to auscultation bilaterally without wheezes or crackles Cardiovascular: Regular rate and rhythm without murmur gallop or rub normal S1 and S2 Abdomen: Nontender, nondistended, soft, bowel sounds positive, no rebound, no ascites, no appreciable mass Extremities: No significant cyanosis, clubbing, or edema bilateral lower extremities -right great toe wound dressed and dry  Basic Metabolic Panel: Recent Labs  Lab 12/14/24 2022 12/16/24 1904  12/17/24 0032  NA 134* 133* 132*  K 4.1 4.0 3.8  CL 97* 96* 97*  CO2 30 28 27   GLUCOSE 114* 108* 100*  BUN 14 14 12   CREATININE 0.94 0.84 0.79  CALCIUM 8.8* 9.2 9.1    CBC: Recent Labs  Lab 12/14/24 2022 12/16/24 1904 12/17/24 0032  WBC 9.8 9.3 8.7  NEUTROABS 5.8 6.1  --   HGB 13.2 13.2 11.6*  HCT 41.6 40.6 35.2*  MCV 100.0 100.0 98.9  PLT 353 351 322      Time spent in discharge (includes decision making & examination of pt): > 35 mins  12/17/2024, 1:28 PM   Reyes IVAR Moores, MD [facility] Office  [phone]

## 2024-12-17 NOTE — Op Note (Signed)
 Mark Hall male 83 y.o. 12/17/2024  PreOperative Diagnosis: Right open hallux fracture, subacute Right hallux transverse laceration, 3 cm   PostOperative Diagnosis: Same  PROCEDURE: Irrigation and debridement of site of open fracture, hallux, right Open reduction of hallux distal phalanx fracture Closure of right hallux transverse laceration, 3 cm, complex  SURGEON: Lonni Pae, MD  ASSISTANT: None  ANESTHESIA: Local infiltration of 1% lidocaine   FINDINGS: Open distal phalanx fracture with bony fragmentation and early healing of the skin due to subacute nature of the injury.  Displaced and angulated hallux distal phalanx fracture    INDICATIONS:83 y.o. male presenting to the emergency department after a fall in the shower approximately a week ago.  It was noted that he had a wound on his foot and he was brought to the emergency department where x-rays revealed a fracture of the distal phalanx and given the location of the wound there was concern for an open fracture.  On my evaluation the patient did indeed have an open fracture and required complex decision making and discussion to proceed with irrigation debridement of the open fracture and open treatment of the fracture with closure of the wound.  Patient opted to proceed with surgery.   Patient understood the risks, benefits and alternatives to surgery which include but are not limited to wound healing complications, infection, nonunion, malunion, need for further surgery as well as damage to surrounding structures. They also understood the potential for continued pain in that there were no guarantees of acceptable outcome After weighing these risks the patient opted to proceed with surgery.  PROCEDURE: After consent was obtained the right foot was sterilized with iodine .  Then using a 22-gauge needle 10 cc of 1% lidocaine  was used to infiltrate the area of the hallux in a digital block fashion.  Once the anesthetic had  set up the foot was reprepped with iodine  and draped in the usual sterile fashion.  I began by inspecting the wound.  There was some early skin healing in the area with some consolidated hematoma.  Using a hemostat and a pickups I was able to remove the consolidated hematoma to adequately inspect the wound.  It was approximately 3 cm in length overlying the proximal aspect of the distal phalanx of the hallux.  The wound communicated with bone greatly and there was comminution at the fracture site.  We were able to then sharply remove any skin that was nonviable from the area as well as bony fragments within the area.  Debridement type: Excisional Debridement  Side: right  Body Location: Foot  Tools used for debridement: scalpel, scissors, and curette  Pre-debridement Wound size (cm):   Length: 3 cm        width: 0.5 cm     depth: 1 cm  Post-debridement Wound size (cm):   Length: 3 cm        width: 0.75 cm     depth: 1 cm  Debridement depth beyond dead/damaged tissue down to healthy viable tissue: yes  Tissue layer involved: skin, subcutaneous tissue, muscle / fascia, bone  Nature of tissue removed: Slough, Devitalized Tissue, and Non-viable tissue  Irrigation volume: 1000 cc of normal saline     Irrigation fluid type: Normal Saline  After acceptable irrigation and debridement of the tissue it was decided to perform open reduction of the fracture.  The wound was carried more lateral and medial in order to acceptably visualize the fracture.  The fracture was dissected out and visualized.  There  was some further displacement and angulation at the fracture site.  Under direct visualization as able to reduce the fracture in a better position.  This was to align the fracture and the AP and lateral planes.  Fracture reduction was confirmed directly.  No need for internal fixation.  We then proceeded to close the wound.  Using a 2-0 nylon suture complex wound closure was performed of the wound  including portion of the skin, nailbed and the nail in order to adequately repair the wound overlying the open fracture and the surgical site.  After closure of the wound the alignment of the toe was acceptable.  The wound was able to be closed in a tension-free fashion.  Soft dressing was placed.  Drapes were removed.  Foot was cleaned.    POST OPERATIVE INSTRUCTIONS: Heel weightbearing in a postoperative shoe Keep dressing in place Follow-up in 1 week's time for wound check Discharge on oral antibiotics for infection prophylaxis given open fracture  TOURNIQUET TIME: No tourniquet  BLOOD LOSS:  Minimal         DRAINS: none         SPECIMEN: none       COMPLICATIONS:  * No surgery found *         Disposition: PACU - hemodynamically stable.         Condition: stable

## 2024-12-17 NOTE — Discharge Instructions (Signed)
 Keep your R great toe wound clean and dry. Keep the current dressing in place until you see the Orthopedic Surgeon in follow up. If it becomes heavily soiled it may be changed as needed. You may bear weight on your right heel, but should avoid putting pressure directly on the right great toe/front of the foot. You will be given a post-op shoe to help you keep that toe elevated when you walk.   Take your antibiotics twice a day as directed until you are seen in follow up by the Orthopedic Surgeon.

## 2024-12-21 LAB — CULTURE, BLOOD (ROUTINE X 2)
Culture: NO GROWTH
Culture: NO GROWTH
Special Requests: ADEQUATE

## 2025-01-07 ENCOUNTER — Other Ambulatory Visit: Payer: Self-pay | Admitting: Surgery

## 2025-01-07 DIAGNOSIS — S065XAA Traumatic subdural hemorrhage with loss of consciousness status unknown, initial encounter: Secondary | ICD-10-CM

## 2025-01-08 ENCOUNTER — Encounter: Payer: Self-pay | Admitting: Surgery

## 2025-01-19 ENCOUNTER — Ambulatory Visit
Admission: RE | Admit: 2025-01-19 | Discharge: 2025-01-19 | Disposition: A | Source: Ambulatory Visit | Attending: Surgery | Admitting: Surgery

## 2025-01-19 ENCOUNTER — Other Ambulatory Visit: Payer: Self-pay | Admitting: Neurological Surgery

## 2025-01-19 DIAGNOSIS — S065XAA Traumatic subdural hemorrhage with loss of consciousness status unknown, initial encounter: Secondary | ICD-10-CM

## 2025-01-31 ENCOUNTER — Encounter (HOSPITAL_COMMUNITY): Payer: Self-pay

## 2025-01-31 ENCOUNTER — Inpatient Hospital Stay (HOSPITAL_COMMUNITY)
Admission: EM | Admit: 2025-01-31 | Source: Home / Self Care | Attending: Emergency Medicine | Admitting: Emergency Medicine

## 2025-01-31 ENCOUNTER — Emergency Department (HOSPITAL_COMMUNITY)

## 2025-01-31 DIAGNOSIS — I421 Obstructive hypertrophic cardiomyopathy: Secondary | ICD-10-CM

## 2025-01-31 DIAGNOSIS — R296 Repeated falls: Secondary | ICD-10-CM

## 2025-01-31 DIAGNOSIS — I10 Essential (primary) hypertension: Secondary | ICD-10-CM | POA: Diagnosis present

## 2025-01-31 DIAGNOSIS — Z9889 Other specified postprocedural states: Secondary | ICD-10-CM

## 2025-01-31 DIAGNOSIS — S065XAA Traumatic subdural hemorrhage with loss of consciousness status unknown, initial encounter: Principal | ICD-10-CM | POA: Diagnosis present

## 2025-01-31 DIAGNOSIS — R42 Dizziness and giddiness: Secondary | ICD-10-CM

## 2025-01-31 DIAGNOSIS — R4182 Altered mental status, unspecified: Secondary | ICD-10-CM | POA: Insufficient documentation

## 2025-01-31 DIAGNOSIS — E871 Hypo-osmolality and hyponatremia: Secondary | ICD-10-CM | POA: Insufficient documentation

## 2025-01-31 DIAGNOSIS — E785 Hyperlipidemia, unspecified: Secondary | ICD-10-CM | POA: Insufficient documentation

## 2025-01-31 DIAGNOSIS — G3184 Mild cognitive impairment, so stated: Secondary | ICD-10-CM | POA: Diagnosis present

## 2025-01-31 LAB — COMPREHENSIVE METABOLIC PANEL WITH GFR
ALT: 11 U/L (ref 0–44)
AST: 37 U/L (ref 15–41)
Albumin: 3.9 g/dL (ref 3.5–5.0)
Alkaline Phosphatase: 76 U/L (ref 38–126)
Anion gap: 12 (ref 5–15)
BUN: 19 mg/dL (ref 8–23)
CO2: 24 mmol/L (ref 22–32)
Calcium: 9.2 mg/dL (ref 8.9–10.3)
Chloride: 93 mmol/L — ABNORMAL LOW (ref 98–111)
Creatinine, Ser: 1.08 mg/dL (ref 0.61–1.24)
GFR, Estimated: >60 mL/min
Glucose, Bld: 101 mg/dL — ABNORMAL HIGH (ref 70–99)
Potassium: 4.1 mmol/L (ref 3.5–5.1)
Sodium: 130 mmol/L — ABNORMAL LOW (ref 135–145)
Total Bilirubin: 0.9 mg/dL (ref 0.0–1.2)
Total Protein: 7.3 g/dL (ref 6.5–8.1)

## 2025-01-31 LAB — URINALYSIS, ROUTINE W REFLEX MICROSCOPIC
Bilirubin Urine: NEGATIVE
Glucose, UA: NEGATIVE mg/dL
Ketones, ur: 5 mg/dL — AB
Leukocytes,Ua: NEGATIVE
Nitrite: NEGATIVE
Protein, ur: 100 mg/dL — AB
Specific Gravity, Urine: 1.019 (ref 1.005–1.030)
pH: 6 (ref 5.0–8.0)

## 2025-01-31 LAB — TROPONIN T, HIGH SENSITIVITY
Troponin T High Sensitivity: 23 ng/L — ABNORMAL HIGH (ref 0–19)
Troponin T High Sensitivity: 23 ng/L — ABNORMAL HIGH (ref 0–19)

## 2025-01-31 LAB — CBC
HCT: 40.3 % (ref 39.0–52.0)
Hemoglobin: 13.4 g/dL (ref 13.0–17.0)
MCH: 31.9 pg (ref 26.0–34.0)
MCHC: 33.3 g/dL (ref 30.0–36.0)
MCV: 96 fL (ref 80.0–100.0)
Platelets: 295 10*3/uL (ref 150–400)
RBC: 4.2 MIL/uL — ABNORMAL LOW (ref 4.22–5.81)
RDW: 13.3 % (ref 11.5–15.5)
WBC: 12.6 10*3/uL — ABNORMAL HIGH (ref 4.0–10.5)
nRBC: 0 % (ref 0.0–0.2)

## 2025-01-31 LAB — TSH: TSH: 3.06 u[IU]/mL (ref 0.350–4.500)

## 2025-01-31 NOTE — ED Triage Notes (Signed)
 Pt presents via EMS from [facility]. EMS reports called out initially for a fall. Pt reports got dizzy and fell and was unable to get up. EMS reports possible LOC. Pt is confused per EMS. Last known normal was Friday when last spoke to family.   C-collar in place on arrival. CBG 139 per EMS.

## 2025-01-31 NOTE — ED Notes (Signed)
 When you have time, [name] (son) [phone] would like an update on pt. Status. Thank you

## 2025-02-01 ENCOUNTER — Inpatient Hospital Stay (HOSPITAL_COMMUNITY): Admitting: Anesthesiology

## 2025-02-01 ENCOUNTER — Encounter (HOSPITAL_COMMUNITY): Admission: EM | Payer: Self-pay | Source: Home / Self Care | Attending: Internal Medicine

## 2025-02-01 ENCOUNTER — Encounter (HOSPITAL_COMMUNITY): Payer: Self-pay | Admitting: Internal Medicine

## 2025-02-01 ENCOUNTER — Inpatient Hospital Stay (HOSPITAL_COMMUNITY)

## 2025-02-01 DIAGNOSIS — I10 Essential (primary) hypertension: Secondary | ICD-10-CM | POA: Diagnosis not present

## 2025-02-01 DIAGNOSIS — R42 Dizziness and giddiness: Secondary | ICD-10-CM

## 2025-02-01 DIAGNOSIS — E7849 Other hyperlipidemia: Secondary | ICD-10-CM

## 2025-02-01 DIAGNOSIS — E871 Hypo-osmolality and hyponatremia: Secondary | ICD-10-CM | POA: Diagnosis not present

## 2025-02-01 DIAGNOSIS — R296 Repeated falls: Secondary | ICD-10-CM

## 2025-02-01 DIAGNOSIS — E785 Hyperlipidemia, unspecified: Secondary | ICD-10-CM | POA: Insufficient documentation

## 2025-02-01 DIAGNOSIS — R011 Cardiac murmur, unspecified: Secondary | ICD-10-CM | POA: Diagnosis not present

## 2025-02-01 DIAGNOSIS — S065XAA Traumatic subdural hemorrhage with loss of consciousness status unknown, initial encounter: Secondary | ICD-10-CM | POA: Diagnosis present

## 2025-02-01 DIAGNOSIS — R4182 Altered mental status, unspecified: Secondary | ICD-10-CM | POA: Insufficient documentation

## 2025-02-01 DIAGNOSIS — Z9889 Other specified postprocedural states: Secondary | ICD-10-CM

## 2025-02-01 LAB — CBC
HCT: 36.5 % — ABNORMAL LOW (ref 39.0–52.0)
Hemoglobin: 12.4 g/dL — ABNORMAL LOW (ref 13.0–17.0)
MCH: 32.2 pg (ref 26.0–34.0)
MCHC: 34 g/dL (ref 30.0–36.0)
MCV: 94.8 fL (ref 80.0–100.0)
Platelets: 288 10*3/uL (ref 150–400)
RBC: 3.85 MIL/uL — ABNORMAL LOW (ref 4.22–5.81)
RDW: 13.3 % (ref 11.5–15.5)
WBC: 11.3 10*3/uL — ABNORMAL HIGH (ref 4.0–10.5)
nRBC: 0 % (ref 0.0–0.2)

## 2025-02-01 LAB — BASIC METABOLIC PANEL WITH GFR
Anion gap: 13 (ref 5–15)
BUN: 16 mg/dL (ref 8–23)
CO2: 23 mmol/L (ref 22–32)
Calcium: 8.5 mg/dL — ABNORMAL LOW (ref 8.9–10.3)
Chloride: 94 mmol/L — ABNORMAL LOW (ref 98–111)
Creatinine, Ser: 0.9 mg/dL (ref 0.61–1.24)
GFR, Estimated: >60 mL/min
Glucose, Bld: 93 mg/dL (ref 70–99)
Potassium: 3.9 mmol/L (ref 3.5–5.1)
Sodium: 130 mmol/L — ABNORMAL LOW (ref 135–145)

## 2025-02-01 LAB — MRSA NEXT GEN BY PCR, NASAL: MRSA by PCR Next Gen: NOT DETECTED

## 2025-02-01 LAB — ECHOCARDIOGRAM COMPLETE
AR max vel: 2.66 cm2
AV Area VTI: 2.15 cm2
AV Area mean vel: 2.55 cm2
AV Mean grad: 15 mmHg
AV Peak grad: 25 mmHg
Ao pk vel: 2.5 m/s
Area-P 1/2: 3.58 cm2
Calc EF: 77.8 %
MV VTI: 4.35 cm2
S' Lateral: 2.2 cm
Single Plane A2C EF: 78.5 %
Single Plane A4C EF: 75.9 %

## 2025-02-01 LAB — RESP PANEL BY RT-PCR (RSV, FLU A&B, COVID)  RVPGX2
Influenza A by PCR: NEGATIVE
Influenza B by PCR: NEGATIVE
Resp Syncytial Virus by PCR: NEGATIVE
SARS Coronavirus 2 by RT PCR: NEGATIVE

## 2025-02-01 LAB — ABO/RH: ABO/RH(D): A POS

## 2025-02-01 LAB — TYPE AND SCREEN
ABO/RH(D): A POS
Antibody Screen: NEGATIVE

## 2025-02-01 MED ORDER — CHLORHEXIDINE GLUCONATE CLOTH 2 % EX PADS
6.0000 | MEDICATED_PAD | Freq: Every day | CUTANEOUS | Status: DC
  Administered 2025-02-01 – 2025-02-03 (×4): 6 via TOPICAL

## 2025-02-01 MED ORDER — BUPIVACAINE HCL (PF) 0.25 % IJ SOLN: INTRAMUSCULAR | Status: DC | PRN

## 2025-02-01 MED ORDER — CEFAZOLIN SODIUM-DEXTROSE 2-4 GM/100ML-% IV SOLN
2.0000 g | Freq: Once | INTRAVENOUS | Status: AC
  Administered 2025-02-01: 2 g via INTRAVENOUS
  Filled 2025-02-01: qty 100

## 2025-02-01 MED ORDER — PERFLUTREN LIPID MICROSPHERE
1.0000 mL | INTRAVENOUS | Status: DC | PRN
  Administered 2025-02-01: 3 mL via INTRAVENOUS

## 2025-02-01 MED ORDER — PHENYLEPHRINE 80 MCG/ML (10ML) SYRINGE FOR IV PUSH (FOR BLOOD PRESSURE SUPPORT)
PREFILLED_SYRINGE | INTRAVENOUS | Status: AC
  Filled 2025-02-01: qty 30

## 2025-02-01 MED ORDER — THROMBIN 5000 UNITS EX KIT
PACK | CUTANEOUS | Status: AC
  Filled 2025-02-01: qty 1

## 2025-02-01 MED ORDER — 0.9 % SODIUM CHLORIDE (POUR BTL) OPTIME
TOPICAL | Status: DC | PRN
  Administered 2025-02-01: 2000 mL

## 2025-02-01 MED ORDER — VASOPRESSIN 20 UNIT/ML IV SOLN
INTRAVENOUS | Status: AC
  Filled 2025-02-01: qty 1

## 2025-02-01 MED ORDER — EPHEDRINE 5 MG/ML INJ
INTRAVENOUS | Status: AC
  Filled 2025-02-01: qty 15

## 2025-02-01 MED ORDER — SUGAMMADEX SODIUM 200 MG/2ML IV SOLN
INTRAVENOUS | Status: DC | PRN
  Administered 2025-02-01: 149.6 mg via INTRAVENOUS

## 2025-02-01 MED ORDER — AMISULPRIDE (ANTIEMETIC) 5 MG/2ML IV SOLN: 10.0000 mg | Freq: Once | INTRAVENOUS | Status: DC | PRN

## 2025-02-01 MED ORDER — ONDANSETRON HCL 4 MG/2ML IJ SOLN: 4.0000 mg | Freq: Four times a day (QID) | INTRAMUSCULAR | Status: DC | PRN

## 2025-02-01 MED ORDER — SODIUM CHLORIDE 0.9% FLUSH: 3.0000 mL | Freq: Two times a day (BID) | INTRAVENOUS | Status: DC

## 2025-02-01 MED ORDER — BACITRACIN ZINC 500 UNIT/GM EX OINT
TOPICAL_OINTMENT | CUTANEOUS | Status: AC
  Filled 2025-02-01: qty 28.35

## 2025-02-01 MED ORDER — EPHEDRINE SULFATE-NACL 50-0.9 MG/10ML-% IV SOSY
PREFILLED_SYRINGE | INTRAVENOUS | Status: DC | PRN
  Administered 2025-02-01: 5 mg via INTRAVENOUS
  Administered 2025-02-01: 2.5 mg via INTRAVENOUS
  Administered 2025-02-01: 5 mg via INTRAVENOUS

## 2025-02-01 MED ORDER — ONDANSETRON HCL 4 MG PO TABS: 4.0000 mg | ORAL_TABLET | Freq: Four times a day (QID) | ORAL | Status: DC | PRN

## 2025-02-01 MED ORDER — ALBUMIN HUMAN 5 % IV SOLN
INTRAVENOUS | Status: AC
  Filled 2025-02-01: qty 250

## 2025-02-01 MED ORDER — ALBUMIN HUMAN 5 % IV SOLN: 25.0000 g | Freq: Once | INTRAVENOUS | Status: DC

## 2025-02-01 MED ORDER — FENTANYL CITRATE (PF) 100 MCG/2ML IJ SOLN
INTRAMUSCULAR | Status: AC
  Filled 2025-02-01: qty 2

## 2025-02-01 MED ORDER — LIDOCAINE 2% (20 MG/ML) 5 ML SYRINGE
INTRAMUSCULAR | Status: DC | PRN
  Administered 2025-02-01: 100 mg via INTRAVENOUS

## 2025-02-01 MED ORDER — ONDANSETRON HCL 4 MG/2ML IJ SOLN
INTRAMUSCULAR | Status: AC
  Filled 2025-02-01: qty 8

## 2025-02-01 MED ORDER — THROMBIN 20000 UNITS EX SOLR
CUTANEOUS | Status: AC
  Filled 2025-02-01: qty 20000

## 2025-02-01 MED ORDER — OXYCODONE HCL 5 MG PO TABS: 5.0000 mg | ORAL_TABLET | Freq: Once | ORAL | Status: DC | PRN

## 2025-02-01 MED ORDER — SIMVASTATIN 5 MG PO TABS
10.0000 mg | ORAL_TABLET | Freq: Every evening | ORAL | Status: AC
  Administered 2025-02-02 – 2025-02-05 (×4): 10 mg via ORAL
  Filled 2025-02-01: qty 1
  Filled 2025-02-01 (×3): qty 2

## 2025-02-01 MED ORDER — FENTANYL CITRATE (PF) 250 MCG/5ML IJ SOLN
INTRAMUSCULAR | Status: DC | PRN
  Administered 2025-02-01: 50 ug via INTRAVENOUS

## 2025-02-01 MED ORDER — ROCURONIUM BROMIDE 10 MG/ML (PF) SYRINGE
PREFILLED_SYRINGE | INTRAVENOUS | Status: AC
  Filled 2025-02-01: qty 30

## 2025-02-01 MED ORDER — THROMBIN 20000 UNITS EX SOLR
CUTANEOUS | Status: DC | PRN
  Administered 2025-02-01: 20 mL via TOPICAL

## 2025-02-01 MED ORDER — CHLORHEXIDINE GLUCONATE 0.12 % MT SOLN: 15.0000 mL | Freq: Once | OROMUCOSAL | Status: AC

## 2025-02-01 MED ORDER — SODIUM CHLORIDE 0.9 % IV SOLN: 250.0000 mL | INTRAVENOUS | Status: DC | PRN

## 2025-02-01 MED ORDER — ONDANSETRON HCL 4 MG PO TABS: 4.0000 mg | ORAL_TABLET | ORAL | Status: AC | PRN

## 2025-02-01 MED ORDER — THROMBIN 5000 UNITS EX SOLR
OROMUCOSAL | Status: DC | PRN
  Administered 2025-02-01: 5 mL via TOPICAL

## 2025-02-01 MED ORDER — PANTOPRAZOLE SODIUM 40 MG PO TBEC: 40.0000 mg | DELAYED_RELEASE_TABLET | Freq: Every day | ORAL | Status: DC

## 2025-02-01 MED ORDER — BACITRACIN ZINC 500 UNIT/GM EX OINT
TOPICAL_OINTMENT | CUTANEOUS | Status: DC | PRN
  Administered 2025-02-01: 1 via TOPICAL

## 2025-02-01 MED ORDER — BUPIVACAINE HCL (PF) 0.5 % IJ SOLN
INTRAMUSCULAR | Status: AC
  Filled 2025-02-01: qty 30

## 2025-02-01 MED ORDER — ACETAMINOPHEN 10 MG/ML IV SOLN: 1000.0000 mg | Freq: Once | INTRAVENOUS | Status: DC | PRN

## 2025-02-01 MED ORDER — LEVETIRACETAM (KEPPRA) 500 MG/5 ML ADULT IV PUSH
500.0000 mg | Freq: Two times a day (BID) | INTRAVENOUS | Status: DC
  Administered 2025-02-01: 500 mg via INTRAVENOUS
  Filled 2025-02-01 (×2): qty 5

## 2025-02-01 MED ORDER — VANCOMYCIN HCL 1000 MG IV SOLR
INTRAVENOUS | Status: AC
  Filled 2025-02-01: qty 20

## 2025-02-01 MED ORDER — SODIUM CHLORIDE 0.9 % IV SOLN: INTRAVENOUS | Status: DC | PRN

## 2025-02-01 MED ORDER — DOCUSATE SODIUM 100 MG PO CAPS
100.0000 mg | ORAL_CAPSULE | Freq: Two times a day (BID) | ORAL | Status: AC
  Administered 2025-02-02 – 2025-02-05 (×3): 100 mg via ORAL
  Filled 2025-02-01 (×7): qty 1

## 2025-02-01 MED ORDER — BUPIVACAINE HCL (PF) 0.5 % IJ SOLN
INTRAMUSCULAR | Status: DC | PRN
  Administered 2025-02-01: 5 mL

## 2025-02-01 MED ORDER — SENNA 8.6 MG PO TABS
1.0000 | ORAL_TABLET | Freq: Two times a day (BID) | ORAL | Status: AC
  Administered 2025-02-02 – 2025-02-05 (×3): 8.6 mg via ORAL
  Filled 2025-02-01 (×6): qty 1

## 2025-02-01 MED ORDER — LABETALOL HCL 5 MG/ML IV SOLN
10.0000 mg | INTRAVENOUS | Status: AC | PRN
  Administered 2025-02-01: 20 mg via INTRAVENOUS
  Filled 2025-02-01 (×2): qty 4

## 2025-02-01 MED ORDER — DEXAMETHASONE SOD PHOSPHATE PF 10 MG/ML IJ SOLN
INTRAMUSCULAR | Status: AC
  Filled 2025-02-01: qty 4

## 2025-02-01 MED ORDER — HYDROMORPHONE HCL 1 MG/ML IJ SOLN
0.5000 mg | INTRAMUSCULAR | Status: DC | PRN
  Administered 2025-02-02: 1 mg via INTRAVENOUS
  Filled 2025-02-01: qty 1

## 2025-02-01 MED ORDER — POLYETHYLENE GLYCOL 3350 17 G PO PACK: 17.0000 g | PACK | Freq: Every day | ORAL | Status: AC | PRN

## 2025-02-01 MED ORDER — CEFAZOLIN SODIUM-DEXTROSE 2-3 GM-%(50ML) IV SOLR
INTRAVENOUS | Status: DC | PRN
  Administered 2025-02-01: 2 g via INTRAVENOUS

## 2025-02-01 MED ORDER — SODIUM CHLORIDE 0.9 % IV BOLUS
500.0000 mL | Freq: Once | INTRAVENOUS | Status: AC
  Administered 2025-02-01: 500 mL via INTRAVENOUS

## 2025-02-01 MED ORDER — ORAL CARE MOUTH RINSE: 15.0000 mL | Freq: Once | OROMUCOSAL | Status: AC

## 2025-02-01 MED ORDER — VASOPRESSIN 20 UNIT/ML IV SOLN
INTRAVENOUS | Status: DC | PRN
  Administered 2025-02-01: 1 [IU] via INTRAVENOUS
  Administered 2025-02-01: 2 [IU] via INTRAVENOUS

## 2025-02-01 MED ORDER — LIDOCAINE-EPINEPHRINE 1 %-1:100000 IJ SOLN
INTRAMUSCULAR | Status: DC | PRN
  Administered 2025-02-01: 5 mL

## 2025-02-01 MED ORDER — PROPOFOL 10 MG/ML IV BOLUS
INTRAVENOUS | Status: DC | PRN
  Administered 2025-02-01: 30 mg via INTRAVENOUS
  Administered 2025-02-01: 50 mg via INTRAVENOUS
  Administered 2025-02-01: 20 mg via INTRAVENOUS

## 2025-02-01 MED ORDER — ALBUMIN HUMAN 5 % IV SOLN
12.5000 g | Freq: Once | INTRAVENOUS | Status: AC
  Administered 2025-02-01: 12.5 g via INTRAVENOUS

## 2025-02-01 MED ORDER — DEXAMETHASONE SOD PHOSPHATE PF 10 MG/ML IJ SOLN
INTRAMUSCULAR | Status: DC | PRN
  Administered 2025-02-01: 10 mg via INTRAVENOUS

## 2025-02-01 MED ORDER — ALBUMIN HUMAN 5 % IV SOLN: INTRAVENOUS | Status: DC | PRN

## 2025-02-01 MED ORDER — HYDROCODONE-ACETAMINOPHEN 5-325 MG PO TABS: 1.0000 | ORAL_TABLET | ORAL | Status: DC | PRN

## 2025-02-01 MED ORDER — OXYCODONE HCL 5 MG/5ML PO SOLN: 5.0000 mg | Freq: Once | ORAL | Status: DC | PRN

## 2025-02-01 MED ORDER — PANTOPRAZOLE SODIUM 40 MG IV SOLR
40.0000 mg | Freq: Every day | INTRAVENOUS | Status: DC
  Administered 2025-02-01: 40 mg via INTRAVENOUS
  Filled 2025-02-01: qty 10

## 2025-02-01 MED ORDER — SODIUM CHLORIDE 0.9% FLUSH
3.0000 mL | Freq: Two times a day (BID) | INTRAVENOUS | Status: DC
  Administered 2025-02-01 (×2): 3 mL via INTRAVENOUS

## 2025-02-01 MED ORDER — ONDANSETRON HCL 4 MG/2ML IJ SOLN: 4.0000 mg | INTRAMUSCULAR | Status: AC | PRN

## 2025-02-01 MED ORDER — ACETAMINOPHEN 325 MG PO TABS: 650.0000 mg | ORAL_TABLET | Freq: Four times a day (QID) | ORAL | Status: DC | PRN

## 2025-02-01 MED ORDER — PROMETHAZINE HCL 25 MG PO TABS: 12.5000 mg | ORAL_TABLET | ORAL | Status: AC | PRN

## 2025-02-01 MED ORDER — PHENYLEPHRINE HCL-NACL 20-0.9 MG/250ML-% IV SOLN
INTRAVENOUS | Status: DC | PRN
  Administered 2025-02-01: 15 ug/min via INTRAVENOUS

## 2025-02-01 MED ORDER — SODIUM CHLORIDE 0.9 % IV SOLN: INTRAVENOUS | Status: DC

## 2025-02-01 MED ORDER — CHLORHEXIDINE GLUCONATE 0.12 % MT SOLN
OROMUCOSAL | Status: AC
  Administered 2025-02-01: 15 mL via OROMUCOSAL
  Filled 2025-02-01: qty 15

## 2025-02-01 MED ORDER — SODIUM CHLORIDE 0.9% FLUSH: 3.0000 mL | INTRAVENOUS | Status: DC | PRN

## 2025-02-01 MED ORDER — LIDOCAINE-EPINEPHRINE 1 %-1:100000 IJ SOLN
INTRAMUSCULAR | Status: AC
  Filled 2025-02-01: qty 1

## 2025-02-01 MED ORDER — LIDOCAINE 2% (20 MG/ML) 5 ML SYRINGE
INTRAMUSCULAR | Status: AC
  Filled 2025-02-01: qty 20

## 2025-02-01 MED ORDER — LABETALOL HCL 5 MG/ML IV SOLN
INTRAVENOUS | Status: DC | PRN
  Administered 2025-02-01: 5 mg via INTRAVENOUS

## 2025-02-01 MED ORDER — ONDANSETRON HCL 4 MG/2ML IJ SOLN
INTRAMUSCULAR | Status: DC | PRN
  Administered 2025-02-01: 4 mg via INTRAVENOUS

## 2025-02-01 MED ORDER — ROCURONIUM BROMIDE 10 MG/ML (PF) SYRINGE
PREFILLED_SYRINGE | INTRAVENOUS | Status: DC | PRN
  Administered 2025-02-01: 60 mg via INTRAVENOUS

## 2025-02-01 MED ORDER — FLEET ENEMA RE ENEM: 1.0000 | ENEMA | Freq: Once | RECTAL | Status: AC | PRN

## 2025-02-01 MED ORDER — FENTANYL CITRATE (PF) 100 MCG/2ML IJ SOLN: 25.0000 ug | INTRAMUSCULAR | Status: DC | PRN

## 2025-02-01 MED ORDER — BISACODYL 10 MG RE SUPP: 10.0000 mg | Freq: Every day | RECTAL | Status: AC | PRN

## 2025-02-01 MED ORDER — DONEPEZIL HCL 10 MG PO TABS
5.0000 mg | ORAL_TABLET | Freq: Every day | ORAL | Status: DC
  Administered 2025-02-01: 5 mg via ORAL
  Filled 2025-02-01: qty 1

## 2025-02-01 MED ORDER — ONDANSETRON HCL 4 MG/2ML IJ SOLN: 4.0000 mg | Freq: Once | INTRAMUSCULAR | Status: DC | PRN

## 2025-02-01 MED ORDER — PHENYLEPHRINE 80 MCG/ML (10ML) SYRINGE FOR IV PUSH (FOR BLOOD PRESSURE SUPPORT)
PREFILLED_SYRINGE | INTRAVENOUS | Status: DC | PRN
  Administered 2025-02-01 (×2): 80 ug via INTRAVENOUS
  Administered 2025-02-01: 160 ug via INTRAVENOUS

## 2025-02-01 MED ORDER — ACETAMINOPHEN 650 MG RE SUPP: 650.0000 mg | Freq: Four times a day (QID) | RECTAL | Status: DC | PRN

## 2025-02-01 MED ORDER — LACTATED RINGERS IV SOLN: INTRAVENOUS | Status: DC

## 2025-02-01 NOTE — Anesthesia Postprocedure Evaluation (Signed)
"   Anesthesia Post Note  Patient: Mark Hall  Procedure(s) Performed: CREATION, CRANIAL BURR HOLE (Right)     Patient location during evaluation: PACU Anesthesia Type: General Level of consciousness: awake and alert Pain management: pain level controlled Vital Signs Assessment: post-procedure vital signs reviewed and stable Respiratory status: spontaneous breathing, nonlabored ventilation and respiratory function stable Cardiovascular status: blood pressure returned to baseline and stable Postop Assessment: no apparent nausea or vomiting Anesthetic complications: no   No notable events documented.  Last Vitals:  Vitals:   02/01/25 2038 02/01/25 2100  BP:  123/78  Pulse:  97  Resp:    Temp: 36.5 C   SpO2:  96%    Last Pain:  Vitals:   02/01/25 2038  TempSrc: Oral  PainSc:                  Khali Albanese,W. EDMOND      "

## 2025-02-01 NOTE — Op Note (Signed)
 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 Postoperative diagnosis: Same Procedure: Bur hole drainage of chronic right subdural hematoma Surgeon: Victory Gens Anesthesia: General Endotracheal Indications: Mark Hall is an 84 year old individual whose had significant fall about a month ago he was found to have a small subdural hematoma the patient had another fall yesterday and was brought to the emergency department as he was somewhat confused and disoriented it is demonstrated that his subdural hematoma has increased substantially such that now he has a 5 mm midline shift and substantial effacement of his lateral ventricle.  Patient was advised regarding the need for surgical decompression via bur hole.  Procedure: The patient was brought to the operating room and placed on the operating table in supine position.  After the smooth induction of general endotracheal anesthesia the patient was placed in the recumbent position with the head slightly flexed forward.  Right side of the head was shaved and an area was chosen in the anterior frontal region and the parietal Barse 4 cm off the midline before the bur hole placement.  The skin was cleansed with alcohol and prepped with DuraPrep and draped in a sterile fashion.  After infiltrating with 10 cc of lidocaine  mixed with epinephrine  and half percent Marcaine  in a 50-50 mixture the incision was made and carried down in the chosen areas.  A self-retaining retractor was placed in the wound and an acorn bit was used to create a bur hole in the frontal region and also in the parietal region.  The dura was noted to be moderately discolored posteriorly.  The dura was cauterized and opened in a cruciate fashion.  Under a modest amount of pressure subdural bloody fluid that was the color of a motor oil was evacuated the fluid was relatively thin.  The second bur hole was opened superiorly and more fluid drained  inferiorly.  Irrigation was continued until the effluent was clear.  Peering into the bur hole 1 could see a thin layer of some chronic and subacute blood but it was near the brain surface.  No further evacuation was then performed after the effluent was clear and a ventriculostomy catheter was [employer] as an implantable drain.  This was brought out through a separate stab incision and tunneled anteriorly.  The inferior hole was then closed after placing some Gelfoam in the bony opening and the galea was closed with 2-0 and 3-0 Vicryl and surgical staples in the scalp.  The catheter was irrigated and the superior portion of the bur hole was then filled with saline.  The catheter itself was capped and a bur hole cover was placed over the bur hole in the frontal region.  The galea was then closed with 3-0 Vicryl in interrupted fashion and surgical staples were used in the scalp.  The drain was secured with a 3-0 nylon suture and was then connected to a straight drainage bag.  A dry sterile dressing was applied to the incisions.  Patient was returned to recovery in stable condition blood loss for the procedure was less than 10 cc.

## 2025-02-01 NOTE — Progress Notes (Signed)
 Patient arrived to 3W, and immediety the OR called for the patient, I conducted a brief assessment and the patient lives alone and has a daughter (at bedside) and a son (not present). Prior to transferring to the OR patient AAOx3, vitals are stable BP elevated in the 160's, report was given to the OR and patient was transferred, no other information was collected due to the short time the patient was on the floor.   Mark Tumolo C Warden, RN 02/01/2025

## 2025-02-01 NOTE — Progress Notes (Incomplete)
 SBP/MAP low. CCM and hospitalist contacted. Pt asymptomatic for hypotension. U/O is 400 mL over 2 hrs.   500 mL bolus normal saline.   Cardiac monitoring showed ST elevation in Lead __, EKG recorded at 2313 but ST elevation had resolved. CCM informed.

## 2025-02-01 NOTE — Anesthesia Preprocedure Evaluation (Addendum)
"                                    Anesthesia Evaluation  Patient identified by MRN, date of birth, ID band Patient awake    Reviewed: Allergy & Precautions, NPO status , Patient's Chart, lab work & pertinent test results  History of Anesthesia Complications Negative for: history of anesthetic complications  Airway Mallampati: II  TM Distance: >3 FB Neck ROM: Full    Dental  (+) Teeth Intact, Dental Advisory Given, Chipped   Pulmonary neg sleep apnea, neg recent URI   breath sounds clear to auscultation       Cardiovascular hypertension, Pt. on medications + Valvular Problems/Murmurs (HOCM)  Rhythm:Regular Rate:Normal + Systolic murmurs TTE (2026):  1. 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 Peak  gradient of with valsalva. Left ventricular ejection fraction, by  estimation, is 70 to 75%. The left ventricle has hyperdynamic function.  The left ventricle has no regional wall  motion abnormalities. There is moderate asymmetric left ventricular  hypertrophy of the septal segment. Left ventricular diastolic parameters  are consistent with Grade II diastolic dysfunction (pseudonormalization).   2. Right ventricular systolic function is normal. The right ventricular  size is normal.   3. Left atrial size was mildly dilated.   4. The mitral valve is normal in structure. No evidence of mitral valve  regurgitation. No evidence of mitral stenosis.   5. The aortic valve is tricuspid. Aortic valve regurgitation is not  visualized. Mild aortic valve stenosis.   6. There is mild dilatation of the aortic root, measuring 38 mm.   7. The inferior vena cava is normal in size with greater than 50%  respiratory variability, suggesting right atrial pressure of 3 mmHg.     Neuro/Psych   Acute Subdural Hematoma      GI/Hepatic ,GERD  Medicated and Controlled,,  Endo/Other  neg diabetes    Renal/GU Renal disease     Musculoskeletal  (+)  Arthritis ,    Abdominal   Peds  Hematology   Anesthesia Other Findings   Reproductive/Obstetrics                              Anesthesia Physical Anesthesia Plan  ASA: 3  Anesthesia Plan: General   Post-op Pain Management:    Induction: Intravenous  PONV Risk Score and Plan: 2 and Ondansetron , Dexamethasone  and Treatment may vary due to age or medical condition  Airway Management Planned: Oral ETT and Video Laryngoscope Planned  Additional Equipment: Arterial line  Intra-op Plan:   Post-operative Plan: Extubation in OR  Informed Consent: I have reviewed the patients History and Physical, chart, labs and discussed the procedure including the risks, benefits and alternatives for the proposed anesthesia with the patient or authorized representative who has indicated his/her understanding and acceptance.     Dental advisory given  Plan Discussed with: CRNA  Anesthesia Plan Comments:          Anesthesia Quick Evaluation  "

## 2025-02-01 NOTE — Transfer of Care (Signed)
 Immediate Anesthesia Transfer of Care Note  Patient: Alm JONELLE Andreas  Procedure(s) Performed: CREATION, CRANIAL BURR HOLE (Right)  Patient Location: PACU  Anesthesia Type:General  Level of Consciousness: awake and alert   Airway & Oxygen Therapy: Patient Spontanous Breathing and Patient connected to face mask oxygen  Post-op Assessment: Report given to RN and Post -op Vital signs reviewed and stable  Post vital signs: Reviewed and stable  Last Vitals:  Vitals Value Taken Time  BP 98/55 02/01/25 19:15  Temp 98   Pulse 93 02/01/25 19:18  Resp 17 02/01/25 19:18  SpO2 95 % 02/01/25 19:18  Vitals shown include unfiled device data.  Last Pain:  Vitals:   02/01/25 1633  TempSrc: Oral  PainSc: 0-No pain         Complications: No notable events documented.

## 2025-02-01 NOTE — Progress Notes (Incomplete)
 Call placed to Dr. Colon to confirm if he indeed wanted patient's ICU transfer to be discontinued, as I had received a call from [facility] in Patient Placement to clarify. Dr. Colon stated that he did not want the order to be discontinued. He requested number for patient placement and called them directly to inform of same. New order for admission to 4N ICU paced and room restored. Call  placed to ICU receiving RN, Therisa to update on pt

## 2025-02-01 NOTE — Consult Note (Cosign Needed Addendum)
 "  NAME:  JANE BROUGHTON, MRN:  [id number], DOB:  15-Aug-1941, LOS: 0 ADMISSION DATE:  01/31/2025 CONSULTATION DATE:  02/01/2025 REFERRING MD:  Colon - NSGY, CHIEF COMPLAINT:  SDH, s/p burr hole   History of Present Illness:  84 year old man who presented to [facility] 2/1 as a transfer from [facility] for fall. PMHx significant for HTN, HLD, mild cognitive decline, skin CA, OA. Recent admission 12/17-12/18 for R great toe injury (requiring I&D/IV antibiotics) s/p fall, also noted to have struck head upon falling resulting in SDH.  Patient presented to [facility] ED [facility] for a [facility]. Patient reportedly became dizzy, fell and was unable to get up, possible LOC with ongoing confusion. LKN 1/30. Previous fall noted ~12/10 resulting in SDH/open R hallux fracture. On ED arrival, patient temp was 99.10F, HR 97, BP 139/101, RR 18, SpO2 99%. Labs were notable for WBC 12.6, Hgb 13.4 (baseline), Plt 295. Na 130, K 4.1, CO2 24, Cr 1.08 (baseline 0.9), LFTs WNL. Trop 23 (flat). TSH WNL. UA with small Hgb, trace ketones/protein. COVID/Flu/RSV negative. CXR unremarkable. 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 CT C-Spine negative for injury. Decision to admit to [facility] for further care. NSGY was consulted.  Patient was taken to OR 2/2 with NSGY (Dr. Colon) for burr hole drainage of SDH. Intraoperative course was uncomplicated and drain was left in place. Patient was extubated and transferred to 4N ICU postoperatively in stable condition.  PCCM consulted for medical management.  Pertinent Medical History:   Past Medical History:  Diagnosis Date   Arthritis    Cancer (HCC)    skin cancer   Hypertension    Pneumonia    Significant Hospital Events: Including procedures, antibiotic start and stop dates in addition to other pertinent events   2/1 - Presented to [facility]  ED post-fall, dizziness/confusion. CT Head demonstrated acute vs. subacute on chronic SDH with increased size from 11/2024 and 5mm leftward midline shift. Admitted to [facility]. NSGY consulted. 2/2 - Taken to OR for burr hole creation (NSGY - [place]). PCCM consulted for medical management postoperatively.  Interim History / Subjective:  PCCM consulted for medical management Feeling well overall, denies pain/HA/dizziness/blurry vision Drowsy but wakes very easily to voice No focal neurologic deficits Daughter at bedside  Objective:  Blood pressure 123/78, pulse 97, temperature 97.7 F (36.5 C), temperature source Oral, resp. rate 17, height 5' 10 (1.778 m), weight 74.8 kg, SpO2 96%.        Intake/Output Summary (Last 24 hours) at 02/01/2025 2233 Last data filed at 02/01/2025 2015 Gross per 24 hour  Intake 906 ml  Output 75 ml  Net 831 ml   Filed Weights   02/01/25 1643  Weight: 74.8 kg   Physical Examination: General: Acutely ill-appearing elderly man in NAD. HEENT: Anicteric sclera, PERRL 3mm, moist mucous membranes. Burr holes x 2 with staple closure postoperatively, dressings clean/dry/intact. Drain in place with serosanguinous output. Neuro: Sleeping, but wakes easily to voice. Answering questions appropriately. Responds to verbal stimuli. Following commands consistently. Moves all 4 extremities spontaneously. Strength equal throughout without focal deficits.  CV: RRR, IV/VI systolic murmur at RUSB. PULM: Breathing even and unlabored on 2LNC. Lung fields CTAB. GI: Soft, nontender, nondistended. Normoactive bowel sounds. Extremities: No LE edema noted. Skin: Warm/dry.  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 CT C-Spine 2/1  negative for injury.  - NSGY primary (Dr. Colon) - S/p OR 2/2 for burr holes - Multimodal pain management (Norco, Dilaudid  PRN, can add APAP alone if not requiring narcotics) - Goal SBP < 160 - Labetalol  PRN to maintain goal SBP - Further brain imaging per NSGY, unless patient has neurologic change - Keppra  for empiric seizure ppx - Frequent neuro checks - Neuroprotective measures: HOB > 30 degrees, normoglycemia, normothermia, electrolytes WNL - PT/OT/SLP as able  HTN HLD - Cardiac monitoring - Goal SBP < 160 as above - Hold home antihypertensives for now in the immediate postop period - Resume home Zocor   Mild cognitive decline - Resume home Aricept  as clinically appropriate  Labs:  CBC: Recent Labs  Lab 01/31/25 2046 02/01/25 0244  WBC 12.6* 11.3*  HGB 13.4 12.4*  HCT 40.3 36.5*  MCV 96.0 94.8  PLT 295 288   Basic Metabolic Panel: Recent Labs  Lab 01/31/25 2046 02/01/25 0244  NA 130* 130*  K 4.1 3.9  CL 93* 94*  CO2 24 23  GLUCOSE 101* 93  BUN 19 16  CREATININE 1.08 0.90  CALCIUM 9.2 8.5*   GFR: Estimated Creatinine Clearance: 64.2 mL/min (by C-G formula based on SCr of 0.9 mg/dL). Recent Labs  Lab 01/31/25 2046 02/01/25 0244  WBC 12.6* 11.3*   Liver Function Tests: Recent Labs  Lab 01/31/25 2046  AST 37  ALT 11  ALKPHOS 76  BILITOT 0.9  PROT 7.3  ALBUMIN  3.9   No results for input(s): LIPASE, AMYLASE in the last 168 hours. No results for input(s): AMMONIA in the last 168 hours.  ABG: No results found for: PHART, PCO2ART, PO2ART, HCO3, TCO2, ACIDBASEDEF, O2SAT   Coagulation Profile: No results for input(s): INR, PROTIME in the last 168 hours.  Cardiac Enzymes: No results for input(s): CKTOTAL, CKMB, CKMBINDEX, TROPONINI in the last 168 hours.  HbA1C: No results found for: HGBA1C  CBG: No results for input(s): GLUCAP in the last 168 hours.  Review of Systems:   Review of Systems   Constitutional:  Negative for chills, fever and malaise/fatigue.  HENT:  Negative for congestion.   Eyes:  Negative for blurred vision, double vision and photophobia.  Respiratory:  Negative for cough and shortness of breath.   Cardiovascular:  Negative for chest pain and palpitations.  Gastrointestinal:  Negative for abdominal pain, diarrhea, nausea and vomiting.  Genitourinary:  Negative for dysuria.  Musculoskeletal:  Positive for falls. Negative for joint pain and neck pain.  Neurological:  Negative for dizziness, focal weakness, seizures, weakness and headaches.   Past Medical History:  He,  has a past medical history of Arthritis, Cancer (HCC), Hypertension, and Pneumonia.   Surgical History:   Past Surgical History:  Procedure Laterality Date   CHOLECYSTECTOMY     INGUINAL HERNIA REPAIR Left 02/28/2024   Procedure: OPEN LEFT INGUINAL HERNIA REPAIR WITH MESH;  Surgeon: Signe Mitzie LABOR, MD;  Location: [facility] ORS;  Service: General;  Laterality: Left;   KNEE ARTHROPLASTY Right    TOTAL KNEE ARTHROPLASTY Left 05/21/2022   Procedure: TOTAL KNEE ARTHROPLASTY;  Surgeon: Melodi Lerner, MD;  Location: [facility] ORS;  Service: Orthopedics;  Laterality: Left;   Social History:   reports that he has never smoked. He has never used smokeless tobacco.  He reports current alcohol use. He reports that he does not use drugs.   Family History:  His family history is not on file.   Allergies: Allergies[1]   Home Medications: Prior to Admission medications  Medication Sig Start Date End Date Taking? Authorizing Provider  acetaminophen  (TYLENOL ) 325 MG tablet Take 2 tablets (650 mg total) by mouth every 6 (six) hours as needed for mild pain (pain score 1-3) or fever (or Fever >/= 101). 12/17/24  Yes Danton Reyes DASEN, MD  amoxicillin  (AMOXIL ) 500 MG capsule Take 2,000 mg by mouth See admin instructions. Take 4 capsules (2000 mg) by mouth 1 hour prior to dental appointments   Yes [provider]  Cholecalciferol (VITAMIN D3) 50 MCG (2000 UT) TABS Take 2,000 Units by mouth in the morning.   Yes [provider]  Cyanocobalamin  (VITAMIN B12 PO) Take 1 tablet by mouth in the morning.   Yes [provider]  donepezil  (ARICEPT ) 5 MG tablet Take 1 tablet (5 mg total) by mouth daily. 10/23/24  Yes Wertman, Sara E, PA-C  Magnesium 250 MG TABS Take 250 mg by mouth at bedtime.   Yes [provider]  Melatonin 10 MG TABS Take 10 mg by mouth at bedtime.   Yes [provider]  simvastatin  (ZOCOR ) 10 MG tablet Take 10 mg by mouth every evening.   Yes [provider]  NIFEdipine  (ADALAT  CC) 30 MG 24 hr tablet Take 30 mg by mouth daily.    [provider]  omeprazole (PRILOSEC) 40 MG capsule Take 40 mg by mouth in the morning. 12/10/24 06/08/25  [provider]  oxyCODONE  (OXY IR/ROXICODONE ) 5 MG immediate release tablet Take 1 tablet (5 mg total) by mouth every 4 (four) hours as needed for moderate pain (pain score 4-6). Patient not taking: Reported on 01/31/2025 12/17/24   Danton Reyes DASEN, MD   Signature:   Corean CHRISTELLA Annette Liotta, PA-C [facility] 02/01/25 10:33 PM  Please see [email or web address] for pager details.  From 7A-7P if no response, please call [phone] After hours, please call ELink [phone]     [1]  Allergies Allergen Reactions   Sulfa Antibiotics Other (See Comments)    Unsure years ago    Lisinopril Swelling    Facial swelling   "

## 2025-02-01 NOTE — Anesthesia Procedure Notes (Signed)
 Arterial Line Insertion Start/End2/01/2025 5:50 PM, 02/01/2025 5:50 PM Performed by: CRNA  Patient location: OR. Preanesthetic checklist: patient identified, IV checked, site marked, risks and benefits discussed, surgical consent, monitors and equipment checked, pre-op evaluation, timeout performed and anesthesia consent Left, radial was placed Catheter size: 20 G Hand hygiene performed  and maximum sterile barriers used   Attempts: 1 Following insertion, dressing applied and Biopatch. Post procedure assessment: normal  Patient tolerated the procedure well with no immediate complications.

## 2025-02-01 NOTE — ED Provider Notes (Signed)
 Patient is a out pending consultation with neurosurgery.  In brief, patient presents with slight worsening of mental status.  Lives in independent living.  Has a history of mild cognitive impairment, hypertension.  Had a fall in December with a subdural.  At that time was admitted for ops and had a repeat CT that was stable.  Has subsequently had a repeat CT scan that showed decrease in subdural.  Did have a fall in the bathroom earlier.  Was last seen normal on Friday by his son.  CT today shows worsening subdural with a 5 mm shift.  12:55 AM Spoke to Dr. Colon.  Neurosurgery will evaluate the patient in the morning.  Requesting repeat admission to the hospitalist. Physical Exam  BP 136/74   Pulse (!) 101   Temp 99.3 F (37.4 C) (Oral)   Resp 20   SpO2 94%   Physical Exam Awake, alert No acute distress Procedures  Procedures  ED Course / MDM    Medical Decision Making Amount and/or Complexity of Data Reviewed Labs: ordered. Radiology: ordered.  Risk Decision regarding hospitalization.          Bari Charmaine FALCON, MD 02/01/25 [phone]

## 2025-02-02 DIAGNOSIS — E785 Hyperlipidemia, unspecified: Secondary | ICD-10-CM | POA: Diagnosis not present

## 2025-02-02 DIAGNOSIS — S065XAA Traumatic subdural hemorrhage with loss of consciousness status unknown, initial encounter: Secondary | ICD-10-CM | POA: Diagnosis not present

## 2025-02-02 DIAGNOSIS — I10 Essential (primary) hypertension: Secondary | ICD-10-CM | POA: Diagnosis not present

## 2025-02-02 LAB — BASIC METABOLIC PANEL WITH GFR
Anion gap: 10 (ref 5–15)
BUN: 22 mg/dL (ref 8–23)
CO2: 22 mmol/L (ref 22–32)
Calcium: 8.2 mg/dL — ABNORMAL LOW (ref 8.9–10.3)
Chloride: 99 mmol/L (ref 98–111)
Creatinine, Ser: 0.88 mg/dL (ref 0.61–1.24)
GFR, Estimated: >60 mL/min
Glucose, Bld: 131 mg/dL — ABNORMAL HIGH (ref 70–99)
Potassium: 4.7 mmol/L (ref 3.5–5.1)
Sodium: 132 mmol/L — ABNORMAL LOW (ref 135–145)

## 2025-02-02 LAB — LACTIC ACID, PLASMA: Lactic Acid, Venous: 1.1 mmol/L (ref 0.5–1.9)

## 2025-02-02 LAB — MAGNESIUM: Magnesium: 2.3 mg/dL (ref 1.7–2.4)

## 2025-02-02 LAB — PHOSPHORUS: Phosphorus: 4.8 mg/dL — ABNORMAL HIGH (ref 2.5–4.6)

## 2025-02-02 LAB — CBC
HCT: 32.8 % — ABNORMAL LOW (ref 39.0–52.0)
Hemoglobin: 10.7 g/dL — ABNORMAL LOW (ref 13.0–17.0)
MCH: 31.7 pg (ref 26.0–34.0)
MCHC: 32.6 g/dL (ref 30.0–36.0)
MCV: 97 fL (ref 80.0–100.0)
Platelets: 267 10*3/uL (ref 150–400)
RBC: 3.38 MIL/uL — ABNORMAL LOW (ref 4.22–5.81)
RDW: 13.2 % (ref 11.5–15.5)
WBC: 7.7 10*3/uL (ref 4.0–10.5)
nRBC: 0.3 % — ABNORMAL HIGH (ref 0.0–0.2)

## 2025-02-02 MED ORDER — LEVETIRACETAM 500 MG PO TABS
500.0000 mg | ORAL_TABLET | Freq: Two times a day (BID) | ORAL | Status: AC
  Administered 2025-02-02 – 2025-02-05 (×8): 500 mg via ORAL
  Filled 2025-02-02 (×8): qty 1

## 2025-02-02 MED ORDER — DONEPEZIL HCL 10 MG PO TABS
5.0000 mg | ORAL_TABLET | Freq: Every day | ORAL | Status: AC
  Administered 2025-02-02 – 2025-02-05 (×4): 5 mg via ORAL
  Filled 2025-02-02 (×4): qty 1

## 2025-02-02 MED ORDER — SODIUM CHLORIDE 1 G PO TABS
1.0000 g | ORAL_TABLET | Freq: Two times a day (BID) | ORAL | Status: AC
  Administered 2025-02-02 – 2025-02-03 (×4): 1 g via ORAL
  Filled 2025-02-02 (×4): qty 1

## 2025-02-02 MED ORDER — PANTOPRAZOLE SODIUM 40 MG PO TBEC
40.0000 mg | DELAYED_RELEASE_TABLET | Freq: Every day | ORAL | Status: AC
  Administered 2025-02-02 – 2025-02-05 (×4): 40 mg via ORAL
  Filled 2025-02-02 (×4): qty 1

## 2025-02-02 MED ORDER — ACETAMINOPHEN 325 MG PO TABS
650.0000 mg | ORAL_TABLET | Freq: Four times a day (QID) | ORAL | Status: AC | PRN
  Administered 2025-02-02 – 2025-02-05 (×5): 650 mg via ORAL
  Filled 2025-02-02 (×5): qty 2

## 2025-02-02 MED ORDER — ORAL CARE MOUTH RINSE: 15.0000 mL | OROMUCOSAL | Status: AC | PRN

## 2025-02-02 NOTE — Progress Notes (Signed)
 Patient ID: Mark Hall, male   DOB: [birth year], 84 y.o.   MRN: [id number] Vital signs are stable.  Patient's drain had less than 20 cc over the last 9 hours.  Fluid is bloody sanguinous.  I remove the drain and placed a dry dressing.  Will continue to monitor patient's status.  Logically seems to be doing quite well without evidence of a drift.  Nursing note that he is unstable on his feet.  He may be a candidate for rehabilitation medicine.  I will ask them to see him along with PT and OT.  He can be transferred from the unit.

## 2025-02-02 NOTE — TOC CM/SW Note (Signed)
 Transition of Care [facility]) - Inpatient Brief Assessment   Patient Details  Name: Mark Hall MRN: [id number] Date of Birth: 28-Apr-1941  Transition of Care [facility]) CM/SW Contact:    Chelesa Weingartner M, RN Phone Number: 02/02/2025, 4:38 PM   Clinical Narrative: 84 year old man who presented to [facility] 2/1 as a transfer from [facility] for fall. 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  Patient s/p Rt Bur hole procedure with Rt ventric drain placement.  PT/OT evaluations pending.  Will follow for discharge needs as patient progresses.     Transition of Care Asessment: Insurance and Status: Insurance coverage has been reviewed Patient has primary care physician: Yes Kristan Batch) Home environment has been reviewed: Lives at [facility] Independent [facility] Prior level of function:: Independent Prior/Current Home Services: No current home services   Readmission risk has been reviewed: Yes Transition of care needs: no transition of care needs at this time  [name], RN, BSN  Trauma/Neuro ICU Case Manager [phone]

## 2025-02-02 NOTE — Progress Notes (Signed)
 eLink Physician-Brief Progress Note Patient Name: Mark Hall DOB: [birth year] MRN: [id number]   Date of Service  02/02/2025  HPI/Events of Note  Patient prefers taking Tylenol  only for pain and refusing the hydrocodone  that is ordered  eICU Interventions  Tylenol  650 q 6 prn ordered for pain and headache Discontinued Hydrocodone -acetaminophen  that was previously ordered     Intervention Category Intermediate Interventions: Pain - evaluation and management  Damien ONEIDA Grout 02/02/2025, 12:44 AM

## 2025-02-03 DIAGNOSIS — R4181 Age-related cognitive decline: Secondary | ICD-10-CM

## 2025-02-03 DIAGNOSIS — W19XXXA Unspecified fall, initial encounter: Secondary | ICD-10-CM

## 2025-02-03 DIAGNOSIS — I10 Essential (primary) hypertension: Secondary | ICD-10-CM | POA: Diagnosis not present

## 2025-02-03 DIAGNOSIS — S065X0A Traumatic subdural hemorrhage without loss of consciousness, initial encounter: Secondary | ICD-10-CM | POA: Diagnosis not present

## 2025-02-03 DIAGNOSIS — E871 Hypo-osmolality and hyponatremia: Secondary | ICD-10-CM | POA: Diagnosis not present

## 2025-02-03 DIAGNOSIS — E785 Hyperlipidemia, unspecified: Secondary | ICD-10-CM | POA: Diagnosis not present

## 2025-02-03 MED ORDER — HEPARIN SODIUM (PORCINE) 5000 UNIT/ML IJ SOLN
5000.0000 [IU] | Freq: Three times a day (TID) | INTRAMUSCULAR | Status: AC
  Administered 2025-02-03 – 2025-02-05 (×8): 5000 [IU] via SUBCUTANEOUS
  Filled 2025-02-03 (×9): qty 1

## 2025-02-03 NOTE — Progress Notes (Signed)
 Pt frequently trying to exit the bed (at least 6 occurrences in the last 4 hrs). Patient reoriented to safety plan. Pt states I don't like this system, but verbalizes understanding of measures necessary to ensure his safety. Frequent verbal contacts and de-escalation necessary. Pt has very poor safety awareness with markedly increased impulsivity. No other neuro changes or signs of confusion/disorientation. All high fall risk measures in place. Floor mats in place. Call bell within reach. Reoriented patient to call equipment.

## 2025-02-03 NOTE — Progress Notes (Addendum)
 Patient ID: Mark Hall, male   DOB: 09/14/1941, 84 y.o.   MRN: [id number] Vital signs are stable Patient is awake alert oriented no evidence of a drift today dressings remain intact with small area about silver dollar size on the posterior suture line.  For evaluation by rehab with PT and OT consults being placed.  Continues to mobilize but patient is somewhat impulsive getting out of bed on his own.  Fall risk precautions.  Patient did indeed have brain compression on initial presentation with significant midline shift.

## 2025-02-03 NOTE — Evaluation (Signed)
 Physical Therapy Evaluation  Patient Details Name: Mark Hall MRN: [id number] DOB: 11-Jan-1941 Today's Date: 02/03/2025  History of Present Illness  Pt is an 84 y/o M admitted to [facility] 01/31/25 for fall in bathroom. ?LOC/head trauma. 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 Pt s/p burr hole and drainage of SDH on 02/01/25. PMHx: HTN, mild cognitive impairment, arthritis.  Clinical Impression  Pt admitted with above. PTA pt living in ILF at [facility]. Pt was driving, management medications, indep with ADLs, and wasn't using an AD per patient report. PT talked to dtr, Randine, on phone to verify information in which she reports he was diagnosed with moderate cognitive deficits this past summer and they have realized he is forgetting to take his medicine daily, isn't showering or practicing proper hygiene, and is no longer safe to drive. 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 Pt is not safe to return to ILF unless 24/7 assist is available due to above deficits. Spoke at length with dtr Randine regarding d/c recommendations and the possible need for patient to have higher level of care consistent. Pt to benefit from inpatient rehab program < 3 hrs a day to allow for increased healing from surgery and to address impaired balance and progress towards mod I functional mobility to then determine ultimately level of care due to impaired cognition ie. ALF, LTC, or memory care unit. Acute PT to cont to follow.        If plan is discharge home, recommend the following: A little help with walking and/or transfers;A little help with bathing/dressing/bathroom;Supervision due to cognitive status;Assist for transportation;Direct supervision/assist for medications management;Direct  supervision/assist for financial management   Can travel by private vehicle   Yes    Equipment Recommendations None recommended by PT  Recommendations for Other Services       Functional Status Assessment Patient has had a recent decline in their functional status and demonstrates the ability to make significant improvements in function in a reasonable and predictable amount of time.     Precautions / Restrictions Precautions Precautions: Fall Recall of Precautions/Restrictions: Impaired Restrictions Weight Bearing Restrictions Per Provider Order: No      Mobility  Bed Mobility Overal bed mobility: Needs Assistance Bed Mobility: Supine to Sit     Supine to sit: Contact guard     General bed mobility comments: pt impulsively standing up at foot of bed with bed rail up upon PT arrival, pt with no awareness of lines attached to LEs and UE. pt stating Im just used to being a free man. Pt reducated on importance of calling for assist    Transfers Overall transfer level: Needs assistance Equipment used: Rolling walker (2 wheels) Transfers: Sit to/from Stand Sit to Stand: Min assist           General transfer comment: minA for line management and safety cues, direction    Ambulation/Gait Ambulation/Gait assistance: Min assist Gait Distance (Feet): 150 Feet Assistive device: Rolling walker (2 wheels) Gait Pattern/deviations: Step-through pattern, Decreased stride length Gait velocity: wfl for age     General Gait Details: minA for management around obstacles, directional verbal cues, assist for way finding back to room  Stairs            Wheelchair Mobility  Tilt Bed    Modified Rankin (Stroke Patients Only) Modified Rankin (Stroke Patients Only) Pre-Morbid Rankin Score: Slight disability Modified Rankin: Moderately severe disability     Balance Overall balance assessment: Needs assistance, History of Falls (recent fall) Sitting-balance  support: Feet supported, No upper extremity supported Sitting balance-Leahy Scale: Good     Standing balance support: Single extremity supported, During functional activity Standing balance-Leahy Scale: Fair                               Pertinent Vitals/Pain Pain Assessment Pain Assessment: No/denies pain    Home Living Family/patient expects to be discharged to:: Assisted living                 Home Equipment: Agricultural Consultant (2 wheels) Additional Comments: lives at [facility], one level, walk in shower with shower seat and hand held shower head    Prior Function Prior Level of Function : Independent/Modified Independent;Driving;History of Falls (last six months)             Mobility Comments: normally ind; no need for assistive device ADLs Comments: reports doing own medication, [employer] provides all meals     Extremity/Trunk Assessment   Upper Extremity Assessment Upper Extremity Assessment: Defer to OT evaluation    Lower Extremity Assessment Lower Extremity Assessment: Generalized weakness    Cervical / Trunk Assessment Cervical / Trunk Assessment: Normal  Communication   Communication Communication: No apparent difficulties    Cognition Arousal: Alert Behavior During Therapy: Impulsive   PT - Cognitive impairments: Awareness, Memory, Problem solving, Safety/Judgement                       PT - Cognition Comments: Pt with no safety awareness. Pt received getting up OOB with bed height elevated, bed rails up, SCDs hooked up in addition to purewick and multiple lines. A&Ox4, no recall or understanding of why staff doesn't want him to get up on his own, pt asking to be d/c'd Following commands: Impaired Following commands impaired: Follows one step commands with increased time     Cueing Cueing Techniques: Verbal cues, Gestural cues, Tactile cues     General Comments General comments (skin integrity, edema, etc.):  drainage in dressing on R skull    Exercises     Assessment/Plan    PT Assessment Patient needs continued PT services  PT Problem List Decreased strength;Decreased activity tolerance;Decreased mobility       PT Treatment Interventions DME instruction;Gait training;Stair training;Functional mobility training;Therapeutic activities;Therapeutic exercise;Balance training    PT Goals (Current goals can be found in the Care Plan section)  Acute Rehab PT Goals Patient Stated Goal: home PT Goal Formulation: With patient/family Time For Goal Achievement: 02/17/25 Potential to Achieve Goals: Good Additional Goals Additional Goal #1: Pt to score > 19 on DGI to indicate minimal falls risk.    Frequency Min 3X/week     Co-evaluation               AM-PAC PT 6 Clicks Mobility  Outcome Measure Help needed turning from your back to your side while in a flat bed without using bedrails?: A Little Help needed moving from lying on your back to sitting on the side of a flat bed without using bedrails?: A Little Help needed moving to and from a bed to a chair (including a wheelchair)?: A Little Help needed standing up from a  chair using your arms (e.g., wheelchair or bedside chair)?: A Little Help needed to walk in hospital room?: A Little Help needed climbing 3-5 steps with a railing? : A Lot 6 Click Score: 17    End of Session Equipment Utilized During Treatment: Gait belt Activity Tolerance: Patient tolerated treatment well Patient left: in chair;with call bell/phone within reach;with chair alarm set Nurse Communication: Mobility status PT Visit Diagnosis: Unsteadiness on feet (R26.81);Muscle weakness (generalized) (M62.81);Difficulty in walking, not elsewhere classified (R26.2)    Time: 9245-9176 PT Time Calculation (min) (ACUTE ONLY): 29 min   Charges:   PT Evaluation $PT Eval Moderate Complexity: 1 Mod PT Treatments $Gait Training: 8-22 mins PT General Charges $$ ACUTE  PT VISIT: 1 Visit         Norene Ames, PT, DPT Acute Rehabilitation Services Secure chat preferred Office #: [phone]   Norene CHRISTELLA Ames 02/03/2025, 10:35 AM

## 2025-02-03 NOTE — Progress Notes (Signed)
 "  NAME:  Mark Hall, MRN:  [id number], DOB:  10-12-1941, LOS: 2 ADMISSION DATE:  01/31/2025 CONSULTATION DATE:  02/01/2025 REFERRING MD:  Colon - NSGY, CHIEF COMPLAINT:  SDH, s/p burr hole   History of Present Illness:  84 year old man who presented to [facility] 2/1 as a transfer from [facility] for fall. PMHx significant for HTN, HLD, mild cognitive decline, skin CA, OA. Recent admission 12/17-12/18 for R great toe injury (requiring I&D/IV antibiotics) s/p fall, also noted to have struck head upon falling resulting in SDH.  Patient presented to [facility] ED after [facility] for a fall. Patient reportedly became dizzy, fell and was unable to get up, possible LOC with ongoing confusion. LKN 1/30. Previous fall noted ~12/10 resulting in SDH/open R hallux fracture. On ED arrival, patient temp was 99.62F, HR 97, BP 139/101, RR 18, SpO2 99%. Labs were notable for WBC 12.6, Hgb 13.4 (baseline), Plt 295. Na 130, K 4.1, CO2 24, Cr 1.08 (baseline 0.9), LFTs WNL. Trop 23 (flat). TSH WNL. UA with small Hgb, trace ketones/protein. COVID/Flu/RSV negative. CXR unremarkable. 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 CT C-Spine negative for injury. Decision to admit to [facility] for further care. NSGY was consulted.  Patient was taken to OR 2/2 with NSGY (Dr. Colon) for burr hole drainage of SDH. Intraoperative course was uncomplicated and drain was left in place. Patient was extubated and transferred to 4N ICU postoperatively in stable condition.  PCCM consulted for medical management.  Pertinent Medical History:   Past Medical History:  Diagnosis Date   Arthritis    Cancer (HCC)    skin cancer   Hypertension    Pneumonia    Significant Hospital Events: Including procedures, antibiotic start and stop dates in addition to other pertinent events   2/1 - Presented to [facility]  ED post-fall, dizziness/confusion. CT Head demonstrated acute vs. subacute on chronic SDH with increased size from 11/2024 and 5mm leftward midline shift. Admitted to [facility]. NSGY consulted. 2/2 - Taken to OR for burr hole creation (NSGY - [place]). PCCM consulted for medical management postoperatively. 2/3 overnight patient blood pressure was low, received albumin  and 500 cc of normal saline.  Blood pressure improved, this morning he is complaining of mild headache around surgical site, denies any other complaint.  Remained afebrile  Interim History / Subjective:  Patient tried to come out of bed few times overnight but now he is oriented, sitting upright in chair Remain afebrile  Objective:  Blood pressure (!) 126/49, pulse 74, temperature 98.4 F (36.9 C), temperature source Oral, resp. rate (!) 21, height 5' 10 (1.778 m), weight 74.8 kg, SpO2 100%.        Intake/Output Summary (Last 24 hours) at 02/03/2025 9171 Last data filed at 02/03/2025 0700 Gross per 24 hour  Intake 20 ml  Output 2490 ml  Net -2470 ml   Filed Weights   02/01/25 1643  Weight: 74.8 kg   Physical Examination: General: Elderly male, lying on the bed HEENT: Status post bur hole, and dressing is clean and dry, eyes anicteric.  moist mucus membranes Neuro: Alert, awake following commands, antigravity in all 4 extremities, no facial droop, speech is clear Chest: Coarse breath sounds, no wheezes or rhonchi Heart: Regular rate and rhythm, no murmurs or gallops Abdomen: Soft, nontender, nondistended, bowel sounds present   Labs reviewed Patient Lines/Drains/Airways Status  Active Line/Drains/Airways     Name Placement date Placement time Site Days   Peripheral IV 01/31/25 18 G Anterior;Right Forearm 01/31/25  2228  Forearm  3   Peripheral IV 01/31/25 20 G Left;Posterior Hand 01/31/25  2235  Hand  3   External Urinary Catheter 02/02/25  2230  --  1   Wound 02/01/25 1856 Surgical Closed Surgical Incision Head  02/01/25  1856  Head  2             Resolved Hospital Problem List:    Assessment & Plan:  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 CT C-Spine 2/1 negative for injury Neurosurgery is following Patient is neurologically intact Continue to watch every 2 hours Subdural drain was removed Maintain SBP less than 160 Continue labetalol  as needed as needed Continue Tylenol  as needed as needed for pain Continue Keppra  for seizure prophylaxis to complete 7-day therapy Continue PT/OT evaluation  HTN HLD Patient blood pressure is stable, hold nifedipine , resume prior to discharge Continue Zocor   Mild cognitive decline Continue Aricept   Chronic hyponatremia Continue salt tablets  Labs:  CBC: Recent Labs  Lab 01/31/25 2046 02/01/25 0244 02/02/25 0452  WBC 12.6* 11.3* 7.7  HGB 13.4 12.4* 10.7*  HCT 40.3 36.5* 32.8*  MCV 96.0 94.8 97.0  PLT 295 288 267   Basic Metabolic Panel: Recent Labs  Lab 01/31/25 2046 02/01/25 0244 02/02/25 0452  NA 130* 130* 132*  K 4.1 3.9 4.7  CL 93* 94* 99  CO2 24 23 22   GLUCOSE 101* 93 131*  BUN 19 16 22   CREATININE 1.08 0.90 0.88  CALCIUM 9.2 8.5* 8.2*  MG  --   --  2.3  PHOS  --   --  4.8*   GFR: Estimated Creatinine Clearance: 65.7 mL/min (by C-G formula based on SCr of 0.88 mg/dL). Recent Labs  Lab 01/31/25 2046 02/01/25 0244 02/02/25 0452  WBC 12.6* 11.3* 7.7  LATICACIDVEN  --   --  1.1   Liver Function Tests: Recent Labs  Lab 01/31/25 2046  AST 37  ALT 11  ALKPHOS 76  BILITOT 0.9  PROT 7.3  ALBUMIN  3.9   No results for input(s): LIPASE, AMYLASE in the last 168 hours. No results for input(s): AMMONIA in the last 168 hours.  ABG: No results found for: PHART, PCO2ART, PO2ART, HCO3, TCO2, ACIDBASEDEF,  O2SAT   Coagulation Profile: No results for input(s): INR, PROTIME in the last 168 hours.  Cardiac Enzymes: No results for input(s): CKTOTAL, CKMB, CKMBINDEX, TROPONINI in the last 168 hours.  HbA1C: No results found for: HGBA1C  CBG: No results for input(s): GLUCAP in the last 168 hours.     Valinda Novas, MD [facility] See Amion for pager If no response to pager, please call [phone] until 7pm After 7pm, Please call E-link [phone]  "

## 2025-02-03 NOTE — Progress Notes (Signed)
 Pt on unit, a&o x4, VSS, pt belongings at bedside including cellphone, ipad and charger. Fall mats placed and call light within reach.   02/03/25 1213  Vitals  Temp 97.9 F (36.6 C)  Temp Source Oral  BP 138/66  MAP (mmHg) 88  BP Location Right Arm  BP Method Automatic  Patient Position (if appropriate) Lying  Pulse Rate 94  Pulse Rate Source Monitor  ECG Heart Rate 94  Resp 20  Level of Consciousness  Level of Consciousness Alert  MEWS COLOR  MEWS Score Color Green  Oxygen Therapy  SpO2 94 %  O2 Device Room Air  MEWS Score  MEWS Temp 0  MEWS Systolic 0  MEWS Pulse 0  MEWS RR 0  MEWS LOC 0  MEWS Score 0

## 2025-02-03 NOTE — Evaluation (Signed)
 Occupational Therapy Evaluation Patient Details Name: Mark Hall MRN: [id number] DOB: 07/12/1941 Today's Date: 02/03/2025   History of Present Illness   Pt is an 84 y/o M admitted to [facility] 01/31/25 for fall in bathroom. ?LOC/head trauma. 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 Pt s/p burr hole and drainage of SDH on 02/01/25. PMHx: HTN, mild cognitive impairment, arthritis.     Clinical Impressions Pt seen for OT evaluation this AM, agreeable for visit. AOX4. PTA, pt was living alone at [facility]. Per discussion with PT (whom spoke with pt's daughter- see PT note for further details), family has noticed decline in pt's cognition since he was diagnosed with mild cognitive impairment last June. He presents today with generalized weakness, flat affect. Somewhat impulsive at times. Functionally, he needed light min A for transfers and CGA for ambulation via RW. Needs up to mod A for LB self-care and CGA with safety cues during grooming. He scored 8/28 on Short Blessed Test, indicating mild cognitive impairment. Noted impairments with recall and insight/safety judgement.   Pt is currently functioning below baseline and would benefit from ongoing acute OT services to progress towards safe discharge and to facilitate return to prior level of function. Current recommendation is post-acute rehab (< 3 hours/day).     If plan is discharge home, recommend the following:   A little help with walking and/or transfers;A little help with bathing/dressing/bathroom;Assistance with cooking/housework;Direct supervision/assist for medications management;Direct supervision/assist for financial management;Supervision due to cognitive status     Functional Status Assessment   Patient has had a recent decline in their functional status and demonstrates the ability to make significant improvements in function in a reasonable and predictable amount of time.     Equipment Recommendations    Other (comment) (defer to next level of care)     Recommendations for Other Services         Precautions/Restrictions   Precautions Precautions: Fall Recall of Precautions/Restrictions: Impaired Restrictions Weight Bearing Restrictions Per Provider Order: No     Mobility Bed Mobility Overal bed mobility: Needs Assistance Bed Mobility: Sit to Supine       Sit to supine: Contact guard assist   General bed mobility comments: assist for LE mgmt, incr time/effort    Transfers Overall transfer level: Needs assistance Equipment used: Rolling walker (2 wheels) Transfers: Sit to/from Stand, Bed to chair/wheelchair/BSC Sit to Stand: Min assist     Step pivot transfers: Contact guard assist     General transfer comment: stood from chair with cues for hand placement and managing lines, needs A for proper RW mgmt and wayfinding back to his room after ambulating in hallway      Balance Overall balance assessment: Needs assistance, History of Falls (fall - reason for admission) Sitting-balance support: No upper extremity supported, Feet supported Sitting balance-Leahy Scale: Good Sitting balance - Comments: seated EOB   Standing balance support: Bilateral upper extremity supported, During functional activity, Reliant on assistive device for balance Standing balance-Leahy Scale: Fair Standing balance comment: used RW, but able to complete dynamic functional tasks without UE support with no LOB                           ADL either performed or assessed with clinical judgement   ADL Overall ADL's : Needs assistance/impaired     Grooming: Contact guard assist;Standing;Wash/dry hands Grooming Details (indicate cue type and reason): sinkside  Upper Body Dressing : Minimal assistance   Lower Body Dressing: Minimal assistance   Toilet Transfer: Contact guard assist;Ambulation;Regular Toilet;Rolling walker (2 wheels) Toilet Transfer Details (indicate  cue type and reason): cues for safe approach and to control descent onto toilet Toileting- Clothing Manipulation and Hygiene: Moderate assistance Toileting - Clothing Manipulation Details (indicate cue type and reason): primifit intact     Functional mobility during ADLs: Contact guard assist;Rolling walker (2 wheels)       Vision Baseline Vision/History: 1 Wears glasses (readers) Patient Visual Report: No change from baseline Vision Assessment?: No apparent visual deficits     Perception         Praxis         Pertinent Vitals/Pain Pain Assessment Pain Assessment: No/denies pain     Extremity/Trunk Assessment Upper Extremity Assessment Upper Extremity Assessment: Generalized weakness   Lower Extremity Assessment Lower Extremity Assessment: Defer to PT evaluation   Cervical / Trunk Assessment Cervical / Trunk Assessment: Normal   Communication Communication Communication: No apparent difficulties   Cognition Arousal: Alert Behavior During Therapy: Flat affect, Impulsive Cognition: Cognition impaired   Orientation impairments:  (AOX4) Awareness: Online awareness impaired Memory impairment (select all impairments): Short-term memory, Working memory Attention impairment (select first level of impairment): Selective attention Executive functioning impairment (select all impairments): Organization, Sequencing, Reasoning, Problem solving OT - Cognition Comments: per discussion with PT (whom spoke with pt's daughter), pt had not been showering regularly and was diagnosed with mild cognitive impairment last June 2025 and family has noticed a gradual cognitive decline since                 Following commands: Impaired Following commands impaired: Follows one step commands with increased time     Cueing  General Comments   Cueing Techniques: Verbal cues;Gestural cues;Tactile cues  VSS   Exercises     Shoulder Instructions      Home Living  Family/patient expects to be discharged to::  [facility])                             Home Equipment: Agricultural Consultant (2 wheels)   Additional Comments: apartment is accessible - 1 level, walk-in shower with grab bars & shower seat/hand held shower hose      Prior Functioning/Environment Prior Level of Function : Independent/Modified Independent;Driving;History of Falls (last six months) (fall reason for admission, pt does not recall events of fall; pt reports he drives locally)             Mobility Comments: no AD PTA ADLs Comments: pt reports using a pillbox for his medications, states facility provides all of his meals, drives locally; enjoys working on his boat model (per pt); retired sport and exercise psychologist    OT Problem List: Decreased strength;Impaired balance (sitting and/or standing);Decreased cognition;Decreased safety awareness   OT Treatment/Interventions: Self-care/ADL training;DME and/or AE instruction;Therapeutic activities;Cognitive remediation/compensation;Patient/family education;Balance training      OT Goals(Current goals can be found in the care plan section)   Acute Rehab OT Goals Patient Stated Goal: go home OT Goal Formulation: With patient Time For Goal Achievement: 02/17/25 Potential to Achieve Goals: Good   OT Frequency:  Min 2X/week    Co-evaluation              AM-PAC OT 6 Clicks Daily Activity     Outcome Measure Help from another person eating meals?: None Help from another person taking care of personal  grooming?: A Little Help from another person toileting, which includes using toliet, bedpan, or urinal?: A Lot Help from another person bathing (including washing, rinsing, drying)?: A Lot Help from another person to put on and taking off regular upper body clothing?: A Little Help from another person to put on and taking off regular lower body clothing?: A Little 6 Click Score: 17   End of Session Equipment Utilized  During Treatment: Gait belt;Rolling walker (2 wheels) Nurse Communication: Mobility status  Activity Tolerance: Patient tolerated treatment well Patient left: in bed;with call bell/phone within reach;with bed alarm set  OT Visit Diagnosis: Unsteadiness on feet (R26.81);History of falling (Z91.81);Cognitive communication deficit (R41.841) Symptoms and signs involving cognitive functions: Nontraumatic intracerebral hemorrhage                Time: 0920-0949 OT Time Calculation (min): 29 min Charges:  OT General Charges $OT Visit: 1 Visit OT Evaluation $OT Eval Moderate Complexity: 1 Mod  Argelia Formisano M. [place], OTR/L [facility] Acute Rehabilitation Services [phone] Secure Chat Preferred  Rikki Burma 02/03/2025, 1:52 PM

## 2025-02-03 NOTE — Progress Notes (Signed)
 Inpatient Rehab Admissions Coordinator:    PT/OT rec SNF as family cannot provide support at dc and ultimately want LTC at ALF/ILF.  Leita Kleine, MS, CCC-SLP Rehab Admissions Coordinator  [phone] (celll) [phone] (office)

## 2025-02-04 ENCOUNTER — Other Ambulatory Visit

## 2025-02-04 ENCOUNTER — Telehealth: Payer: Self-pay | Admitting: Physician Assistant

## 2025-02-04 NOTE — Telephone Encounter (Signed)
 Mark Hall called in stating that her dad had a fall, and had a brain bleed. She is wanting to know if the appt that is scheduled needed to be moved up.   PH: [phone]

## 2025-02-04 NOTE — Care Management Important Message (Signed)
 Important Message  Patient Details  Name: Mark Hall MRN: [id number] Date of Birth: 24-Mar-1941   Important Message Given:  Yes - Medicare IM     Jennie Laneta Dragon 02/04/2025, 4:10 PM

## 2025-02-04 NOTE — Progress Notes (Signed)
" °  Progress Note   Date: 02/03/2025  Patient Name: Mark Hall        MRN#: [id number]   The diagnostic studies of the brain supports a diagnosis of: The patient's initial scan demonstrated that indeed there was brain compression from the subdural hematoma causing effacement shift on the scan.  Brain compression     "

## 2025-02-04 NOTE — NC FL2 (Signed)
 " [place]  MEDICAID FL2 LEVEL OF CARE FORM     IDENTIFICATION  Patient Name: Mark Hall Birthdate: 1941-12-02 Sex: male Admission Date (Current Location): 01/31/2025  [facility] and [place] Number:  Producer, Television/film/video and Address:  [facility] [place], [address], [place], [place] [address]      Provider Number: [id number]  Attending Physician Name and Address:  Colon Shove, MD  Relative Name and Phone Number:       Current Level of Care: Hospital Recommended Level of Care: Skilled Nursing Facility Prior Approval Number:    Date Approved/Denied:   PASRR Number: [id number]  Discharge Plan: SNF    Current Diagnoses: Patient Active Problem List   Diagnosis Date Noted   Recurrent falls 02/01/2025   Altered mental status- confusion 02/01/2025   Dizziness 02/01/2025   Hyperlipidemia 02/01/2025   Hyponatremia 02/01/2025   Acute subdural hematoma (HCC) 02/01/2025   Status post surgery 02/01/2025   Subdural hematoma (HCC) 12/17/2024   Open displaced fracture of distal phalanx of right great toe, initial encounter 12/16/2024   Subacute subdural hematoma (HCC) 12/16/2024   Hypertension    Mild cognitive impairment 07/14/2024   OA (osteoarthritis) of knee 05/21/2022   Primary osteoarthritis of left knee 05/21/2022    Orientation RESPIRATION BLADDER Height & Weight     Self, Time, Situation, Place  Normal Continent Weight: 165 lb (74.8 kg) Height:  5' 10 (177.8 cm)  BEHAVIORAL SYMPTOMS/MOOD NEUROLOGICAL BOWEL NUTRITION STATUS      Continent Diet (please se discharge summary)  AMBULATORY STATUS COMMUNICATION OF NEEDS Skin   Limited Assist Verbally Skin abrasions (wound rught thigh/anterior, closed incision head, abrasion arm,leg, thigh, at risk for pressure wounds)                       Personal Care Assistance Level of Assistance  Bathing, Feeding, Dressing Bathing Assistance: Limited assistance Feeding assistance: Independent Dressing  Assistance: Limited assistance     Functional Limitations Info  Sight, Hearing, Speech Sight Info: Impaired (glasses) Hearing Info: Adequate Speech Info: Adequate    SPECIAL CARE FACTORS FREQUENCY  PT (By licensed PT), OT (By licensed OT)     PT Frequency: 5x per wek OT Frequency: 5x per week            Contractures Contractures Info: Not present    Additional Factors Info  Code Status, Allergies Code Status Info: FULL Allergies Info: Sulfa Antibiotics, Lisinopril           Current Medications (02/04/2025):  This is the current hospital active medication list Current Facility-Administered Medications  Medication Dose Route Frequency Provider Last Rate Last Admin   acetaminophen  (TYLENOL ) tablet 650 mg  650 mg Oral Q6H PRN Marion Damien DASEN, MD   650 mg at 02/02/25 2002   bisacodyl  (DULCOLAX) suppository 10 mg  10 mg Rectal Daily PRN Colon Shove, MD       Chlorhexidine  Gluconate Cloth 2 % PADS 6 each  6 each Topical Daily Colon Shove, MD   6 each at 02/03/25 1048   docusate sodium  (COLACE) capsule 100 mg  100 mg Oral BID Colon Shove, MD   100 mg at 02/03/25 1048   donepezil  (ARICEPT ) tablet 5 mg  5 mg Oral QHS Chand, Sudham, MD   5 mg at 02/03/25 2250   heparin  injection 5,000 Units  5,000 Units Subcutaneous Q8H Harold Scholz, MD   5,000 Units at 02/04/25 9364   labetalol  (NORMODYNE ) injection  10-40 mg  10-40 mg Intravenous Q10 min PRN Colon Shove, MD   20 mg at 02/01/25 2107   levETIRAcetam  (KEPPRA ) tablet 500 mg  500 mg Oral BID Harold Scholz, MD   500 mg at 02/04/25 9087   ondansetron  (ZOFRAN ) tablet 4 mg  4 mg Oral Q4H PRN Colon Shove, MD       Or   ondansetron  (ZOFRAN ) injection 4 mg  4 mg Intravenous Q4H PRN Colon Shove, MD       Oral care mouth rinse  15 mL Mouth Rinse PRN Colon Shove, MD       pantoprazole  (PROTONIX ) EC tablet 40 mg  40 mg Oral QHS Colon Shove, MD   40 mg at 02/03/25 2250   polyethylene glycol (MIRALAX  / GLYCOLAX ) packet 17 g  17 g  Oral Daily PRN Colon Shove, MD       promethazine  (PHENERGAN ) tablet 12.5-25 mg  12.5-25 mg Oral Q4H PRN Colon Shove, MD       senna (SENOKOT) tablet 8.6 mg  1 tablet Oral BID Colon Shove, MD   8.6 mg at 02/03/25 2250   simvastatin  (ZOCOR ) tablet 10 mg  10 mg Oral QPM Ilah Krabbe M, PA-C   10 mg at 02/03/25 1753   sodium phosphate  (FLEET) enema 1 enema  1 enema Rectal Once PRN Colon Shove, MD         Discharge Medications: Please see discharge summary for a list of discharge medications.  Relevant Imaging Results:  Relevant Lab Results:   Additional Information SSN [id number]  Montie LOISE Louder, LCSW     "

## 2025-02-04 NOTE — Plan of Care (Signed)
" °  Problem: Education: Goal: Knowledge of General Education information will improve Description: Including pain rating scale, medication(s)/side effects and non-pharmacologic comfort measures Outcome: Progressing   Problem: Activity: Goal: Risk for activity intolerance will decrease Outcome: Progressing   Problem: Nutrition: Goal: Adequate nutrition will be maintained Outcome: Progressing   Problem: Safety: Goal: Ability to remain free from injury will improve Outcome: Progressing   Problem: Neurological: Goal: Will regain or maintain usual level of consciousness Outcome: Progressing   "

## 2025-02-04 NOTE — Progress Notes (Signed)
 Physical Therapy Treatment Patient Details Name: Mark Hall MRN: [id number] DOB: Nov 28, 1941 Today's Date: 02/04/2025   History of Present Illness Pt is an 84 y/o M admitted to [facility] 01/31/25 for fall in bathroom. ?LOC/head trauma. 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 Pt s/p burr hole and drainage of SDH on 02/01/25. PMHx: HTN, mild cognitive impairment, arthritis.    PT Comments  Patient progressing with mobility working on high level balance activities with UE support.  Still with impaired safety awareness though more calm and waiting for set up prior to mobility today with daughter in the room.  Feel he remains appropriate for post-acute inpatient rehab (<3 hours/day) at d/c.   If plan is discharge home, recommend the following: A little help with walking and/or transfers;A little help with bathing/dressing/bathroom;Supervision due to cognitive status;Assist for transportation;Direct supervision/assist for medications management;Direct supervision/assist for financial management   Can travel by private vehicle        Equipment Recommendations  None recommended by PT    Recommendations for Other Services       Precautions / Restrictions Precautions Precautions: Fall Recall of Precautions/Restrictions: Impaired     Mobility  Bed Mobility Overal bed mobility: Needs Assistance Bed Mobility: Supine to Sit     Supine to sit: Supervision Sit to supine: Supervision   General bed mobility comments: assist for lines, safety    Transfers Overall transfer level: Needs assistance Equipment used: Rolling walker (2 wheels) Transfers: Sit to/from Stand Sit to Stand: Contact guard assist           General transfer comment: for balance and lines    Ambulation/Gait Ambulation/Gait assistance: Contact guard assist Gait Distance (Feet): 150 Feet (x 2) Assistive device: Rolling walker (2 wheels) Gait Pattern/deviations: Step-through pattern, Decreased stride length        General Gait Details: walker adjusted for height, assist for wayfinding and safety   Stairs             Wheelchair Mobility     Tilt Bed    Modified Rankin (Stroke Patients Only)       Balance Overall balance assessment: Needs assistance, History of Falls Sitting-balance support: No upper extremity supported, Feet supported Sitting balance-Leahy Scale: Good     Standing balance support: During functional activity Standing balance-Leahy Scale: Fair Standing balance comment: static balance without UE support, using 1 vs 2 UE support for dynamic activities             High level balance activites: Side stepping, Backward walking High Level Balance Comments: at rail in hallway completed side stepping (B UE support), forward march (single UE support w/ CGA), backward walking (single UE support w/ CGA), forward tandem (single UE support w/ CGA), side kicks alternating (B UE support), partial squats (B UE support) and heel raises (B UE support)            Communication Communication Communication: No apparent difficulties  Cognition Arousal: Alert Behavior During Therapy: WFL for tasks assessed/performed   PT - Cognitive impairments: Memory, Problem solving                       PT - Cognition Comments: verbalizing discontent with limited mobility in hospital though appropriately waiting on PT to set up environment for safety today with daughter in the room Following commands: Impaired Following commands impaired: Follows multi-step commands with increased time    Cueing Cueing Techniques: Verbal cues, Visual cues  Exercises  General Comments General comments (skin integrity, edema, etc.): daughter Blanchard) present and supportive      Pertinent Vitals/Pain Pain Assessment Pain Assessment: No/denies pain    Home Living                          Prior Function            PT Goals (current goals can now be found in the care  plan section) Progress towards PT goals: Progressing toward goals    Frequency    Min 2X/week      PT Plan      Co-evaluation              AM-PAC PT 6 Clicks Mobility   Outcome Measure  Help needed turning from your back to your side while in a flat bed without using bedrails?: A Little Help needed moving from lying on your back to sitting on the side of a flat bed without using bedrails?: A Little Help needed moving to and from a bed to a chair (including a wheelchair)?: A Little Help needed standing up from a chair using your arms (e.g., wheelchair or bedside chair)?: A Little Help needed to walk in hospital room?: A Little Help needed climbing 3-5 steps with a railing? : A Lot 6 Click Score: 17    End of Session Equipment Utilized During Treatment: Gait belt Activity Tolerance: Patient tolerated treatment well Patient left: in bed;with call bell/phone within reach;with bed alarm set;with family/visitor present   PT Visit Diagnosis: Muscle weakness (generalized) (M62.81);Other symptoms and signs involving the nervous system (R29.898);Other abnormalities of gait and mobility (R26.89)     Time: 8441-8378 PT Time Calculation (min) (ACUTE ONLY): 23 min  Charges:    $Gait Training: 8-22 mins $Neuromuscular Re-education: 8-22 mins PT General Charges $$ ACUTE PT VISIT: 1 Visit                     Micheline Portal, PT Acute Rehabilitation Services Office:[phone] 02/04/2025    [name] 02/04/2025, 5:53 PM

## 2025-02-04 NOTE — Plan of Care (Signed)
" °  Problem: Education: Goal: Knowledge of General Education information will improve Description: Including pain rating scale, medication(s)/side effects and non-pharmacologic comfort measures Outcome: Progressing   Problem: Health Behavior/Discharge Planning: Goal: Ability to manage health-related needs will improve Outcome: Progressing   Problem: Clinical Measurements: Goal: Ability to maintain clinical measurements within normal limits will improve Outcome: Progressing Goal: Will remain free from infection Outcome: Progressing Goal: Diagnostic test results will improve Outcome: Progressing Goal: Respiratory complications will improve Outcome: Progressing Goal: Cardiovascular complication will be avoided Outcome: Progressing   Problem: Activity: Goal: Risk for activity intolerance will decrease Outcome: Progressing   Problem: Nutrition: Goal: Adequate nutrition will be maintained Outcome: Progressing   Problem: Coping: Goal: Level of anxiety will decrease Outcome: Progressing   Problem: Elimination: Goal: Will not experience complications related to bowel motility Outcome: Progressing Goal: Will not experience complications related to urinary retention Outcome: Progressing   Problem: Pain Managment: Goal: General experience of comfort will improve and/or be controlled Outcome: Progressing   Problem: Safety: Goal: Ability to remain free from injury will improve Outcome: Progressing   Problem: Skin Integrity: Goal: Risk for impaired skin integrity will decrease Outcome: Progressing   Problem: Education: Goal: Knowledge of the prescribed therapeutic regimen will improve Outcome: Progressing   Problem: Bowel/Gastric: Goal: Gastrointestinal status for postoperative course will improve Outcome: Progressing   Problem: Cardiac: Goal: Ability to maintain an adequate cardiac output Outcome: Progressing Goal: Will show no evidence of cardiac arrhythmias Outcome:  Progressing   Problem: Nutritional: Goal: Will attain and maintain optimal nutritional status Outcome: Progressing   Problem: Neurological: Goal: Will regain or maintain usual level of consciousness Outcome: Progressing   Problem: Clinical Measurements: Goal: Ability to maintain clinical measurements within normal limits Outcome: Progressing Goal: Postoperative complications will be avoided or minimized Outcome: Progressing   Problem: Respiratory: Goal: Will regain and/or maintain adequate ventilation Outcome: Progressing Goal: Respiratory status will improve Outcome: Progressing   Problem: Skin Integrity: Goal: Demonstrates signs of wound healing without infection Outcome: Progressing   Problem: Urinary Elimination: Goal: Will remain free from infection Outcome: Progressing Goal: Ability to achieve and maintain adequate urine output Outcome: Progressing   Problem: Education: Goal: Knowledge of the prescribed therapeutic regimen will improve Outcome: Progressing   Problem: Clinical Measurements: Goal: Usual level of consciousness will be regained or maintained. Outcome: Progressing Goal: Neurologic status will improve Outcome: Progressing Goal: Ability to maintain intracranial pressure will improve Outcome: Progressing   Problem: Skin Integrity: Goal: Demonstration of wound healing without infection will improve Outcome: Progressing   "

## 2025-02-04 NOTE — TOC Progression Note (Addendum)
 Transition of Care [facility]) - Progression Note    Patient Details  Name: Mark Hall MRN: [id number] Date of Birth: [birth year]  Transition of Care [facility]) CM/SW Contact  Montie LOISE Louder, [place] Phone Number: 02/04/2025, 4:31 PM  Clinical Narrative:     Patient's daughter, Randine- requested [facility] send SNF referrals: [facility] [phone] fax # [phone]- they will review referral  [facility]- [phone]  fax # [phone]- no beds available   [facility]- [phone] fax # [phone]- unable to reach admission, left voice message  but fax clinicals to review   Patient's daughter updated  TOC will continue to follow and assist with discharge planning.  Montie Louder, MSW, LCSW Clinical Social Worker    Expected Discharge Plan: Skilled Nursing Facility Barriers to Discharge: Continued Medical Work up               Expected Discharge Plan and Services                                               Social Drivers of Health (SDOH) Interventions SDOH Screenings   Food Insecurity: No Food Insecurity (02/02/2025)  Housing: Unknown (02/02/2025)  Utilities: Not At Risk (02/03/2025)  Social Connections: Unknown (02/03/2025)  Tobacco Use: Low Risk (02/01/2025)  Health Literacy: Unknown (10/12/2022)   Received from [facility]    Readmission Risk Interventions     No data to display

## 2025-02-04 NOTE — Progress Notes (Signed)
 Patient ID: Mark Hall, male   DOB: 03-19-1941, 84 y.o.   MRN: [id number] Patient is ambulatory and walking with the use of a walker.  Moderate unsteadiness still noted.  According to rehab coordinator the patient's daughter would like him to go to skilled nursing facility.  Those arrangements are being made.

## 2025-02-04 NOTE — Telephone Encounter (Signed)
 Will place on the cancelling list per Camie Dina RIGGERS.

## 2025-02-05 ENCOUNTER — Other Ambulatory Visit (HOSPITAL_COMMUNITY): Payer: Self-pay

## 2025-02-05 DIAGNOSIS — I421 Obstructive hypertrophic cardiomyopathy: Secondary | ICD-10-CM

## 2025-02-05 MED ORDER — LEVETIRACETAM 500 MG PO TABS
500.0000 mg | ORAL_TABLET | Freq: Every day | ORAL | 1 refills | Status: AC
  Filled 2025-02-05: qty 30, 30d supply, fill #0

## 2025-02-05 MED ORDER — METOPROLOL TARTRATE 25 MG PO TABS
25.0000 mg | ORAL_TABLET | Freq: Two times a day (BID) | ORAL | Status: AC
  Administered 2025-02-05: 25 mg via ORAL
  Filled 2025-02-05: qty 1

## 2025-02-05 MED ORDER — NIFEDIPINE ER OSMOTIC RELEASE 30 MG PO TB24
30.0000 mg | ORAL_TABLET | Freq: Every day | ORAL | Status: AC
  Filled 2025-02-05: qty 1

## 2025-02-05 NOTE — Consult Note (Signed)
 "  Cardiology Consultation   Patient ID: Mark Hall MRN: [id number]; DOB: Jun 09, 1941  Admit date: 01/31/2025 Date of Consult: 02/05/2025  PCP:  Marvene Prentice JONELLE, [name]   [facility] Providers Cardiologist:  None   Patient Profile: Mark Hall is a 84 y.o. 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  History of Present Illness: Mr. Mark Hall has no prior cardiac history.  He has a daughter who has HOCM status post myectomy around the age of 68.  No other family members with known sudden cardiac death or HOCM.  He has 2 daughters and 1 son.  Patient has recent admission 11/2024 where he was admitted for what was felt to be a mechanical fall and had suffered from a subdural hematoma as well as a right open toe fracture status post I&D and IV antibiotics.  He returns again for a fall and now status post bur hole drainage on the right because he had signs of cerebral compression and midline shift on CT.  An echocardiogram was ordered this admission concerning for HOCM so cardiology was consulted.  Patient reports that at baseline he lives in a independent facility and self-sufficient without any exertional limitations.  He is unable to recall why he has fallen on either these occasions but does not think he lost consciousness.  Does not clearly remember any dizziness or preceding symptoms.  He has had no complaints of palpitations, shortness of breath, peripheral edema, chest pain, syncope in the past.  Although before he was walking about a mile a day but says he cut this back for unclear reasons but not necessarily due to exertional difficulties.  Both daughters were called during visit.   Past Medical History:  Diagnosis Date   Arthritis    Cancer (HCC)    skin cancer   Hypertension    Pneumonia     Past  Surgical History:  Procedure Laterality Date   CHOLECYSTECTOMY     INGUINAL HERNIA REPAIR Left 02/28/2024   Procedure: OPEN LEFT INGUINAL HERNIA REPAIR WITH MESH;  Surgeon: Signe Mitzie LABOR, MD;  Location: [facility] OR[facility];  Service: General;  Laterality: Left;   KNEE ARTHROPLASTY Right    TOTAL KNEE ARTHROPLASTY Left 05/21/2022   Procedure: TOTAL KNEE ARTHROPLASTY;  Surgeon: Melodi Lerner, MD;  Location: [facility] OR[facility];  Service: Orthopedics;  Laterality: Left;     Scheduled Meds:  docusate sodium   100 mg Oral BID   donepezil   5 mg Oral QHS   heparin  injection (subcutaneous)  5,000 Units Subcutaneous Q8H   levETIRAcetam   500 mg Oral BID   pantoprazole   40 mg Oral QHS   senna  1 tablet Oral BID   simvastatin   10 mg Oral QPM   Continuous Infusions:  PRN Meds: acetaminophen , bisacodyl , labetalol , ondansetron  **OR** ondansetron  (ZOFRAN ) IV, mouth rinse, polyethylene glycol, promethazine , sodium phosphate   Allergies:   Allergies[1]  Social History:   Social History   Socioeconomic History   Marital status: Widowed    Spouse name: Not on file   Number of children: 3   Years of education: 22   Highest education level: Not on file  Occupational History   Not on file  Tobacco Use   Smoking status: Never   Smokeless tobacco: Never  Vaping Use   Vaping status: Never Used  Substance and Sexual Activity  Alcohol use: Yes    Comment: occ   Drug use: Never   Sexual activity: Not on file  Other Topics Concern   Not on file  Social History Narrative   Right handed   Drinks caffeine   Lives alone   Social Drivers of Health   Tobacco Use: Low Risk (02/01/2025)   Patient History    Smoking Tobacco Use: Never    Smokeless Tobacco Use: Never    Passive Exposure: Not on file  Financial Resource Strain: Not on file  Food Insecurity: No Food Insecurity (02/02/2025)   Epic    Worried About Programme Researcher, Broadcasting/film/video in the Last Year: Never true    Ran Out of Food in the Last Year: Never true   Transportation Needs: Not on file  Physical Activity: Not on file  Stress: Not on file  Social Connections: Unknown (02/03/2025)   Social Connection and Isolation Panel    Frequency of Communication with Friends and Family: Not on file    Frequency of Social Gatherings with Friends and Family: Not on file    Attends Religious Services: Not on file    Active Member of Clubs or Organizations: Not on file    Attends Banker Meetings: Not on file    Marital Status: Widowed  Intimate Partner Violence: Not At Risk (02/02/2025)   Epic    Fear of Current or Ex-Partner: No    Emotionally Abused: No    Physically Abused: No    Sexually Abused: No  Depression (PHQ2-9): Not on file  Alcohol Screen: Not on file  Housing: Unknown (02/02/2025)   Epic    Unable to Pay for Housing in the Last Year: No    Number of Times Moved in the Last Year: Not on file    Homeless in the Last Year: No  Utilities: Not At Risk (02/03/2025)   Epic    Threatened with loss of utilities: No  Health Literacy: Unknown (10/12/2022)   Received from [facility]    Help with school or training?: Not on file    Preferred Language: Not on file    Family History:   History reviewed. No pertinent family history.   ROS:  Please see the history of present illness.  All other ROS reviewed and negative.     Physical Exam/Data: Vitals:   02/04/25 2333 02/05/25 0320 02/05/25 0702 02/05/25 1245  BP: (!) 143/78 (!) 145/64 (!) 156/87 123/67  Pulse: 82 81 98 84  Resp: 19 16 20 19   Temp: 98.3 F (36.8 C) 98.5 F (36.9 C) 98.6 F (37 C) 98.4 F (36.9 C)  TempSrc: Oral Axillary Oral Oral  SpO2: 94% 93% (!) 89% 94%  Weight:      Height:       No intake or output data in the 24 hours ending 02/05/25 1523    02/01/2025    4:43 PM 12/16/2024    6:41 PM 10/23/2024   12:44 PM  Last 3 Weights  Weight (lbs) 165 lb 164 lb 14.5 oz 165 lb  Weight (kg) 74.844 kg 74.8 kg 74.844 kg     Body  mass index is 23.68 kg/m.  General:  Well nourished, well developed, in no acute distress HEENT: normal Neck: no JVD Vascular: No carotid bruits; Distal pulses 2+ bilaterally Cardiac:  normal S1, S2; RRR; 2 out of 6 murmur sternal border Lungs:  clear to auscultation bilaterally, no wheezing, rhonchi or rales  Abd:  soft, nontender, no hepatosinus rhythm heart rate 86.  No acute ST-T wave changes.  Megaly  Ext: no edema Musculoskeletal:  No deformities, BUE and BLE strength normal and equal Skin: warm and dry  Neuro:  CNs 2-12 intact, no focal abnormalities noted Psych:  Normal affect   EKG:  The EKG was personally reviewed and demonstrates: Sinus rhythm heart rate 86.  No acute ST-T wave changes. Telemetry:  Telemetry was personally reviewed and demonstrates: Sinus rhythm heart rates in the 80s, PVCs in a pattern of bigeminy  Relevant CV Studies: Echocardiogram 02/01/2025 1. 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 Peak  gradient of with valsalva. Left ventricular ejection fraction, by  estimation, is 70 to 75%. The left ventricle has hyperdynamic function.  The left ventricle has no regional wall  motion abnormalities. There is moderate asymmetric left ventricular  hypertrophy of the septal segment. Left ventricular diastolic parameters  are consistent with Grade II diastolic dysfunction (pseudonormalization).   2. Right ventricular systolic function is normal. The right ventricular  size is normal.   3. Left atrial size was mildly dilated.   4. The mitral valve is normal in structure. No evidence of mitral valve  regurgitation. No evidence of mitral stenosis.   5. The aortic valve is tricuspid. Aortic valve regurgitation is not  visualized. Mild aortic valve stenosis.   6. There is mild dilatation of the aortic root, measuring 38 mm.   7. The inferior vena cava is normal in size with greater than 50%  respiratory variability, suggesting right atrial  pressure of 3 mmHg.    Laboratory Data: High Sensitivity Troponin:  No results for input(s): TROPONINIHS in the last 720 hours.  Recent Labs  Lab 01/31/25 2046 01/31/25 2227  TRNPT 23* 23*      Chemistry Recent Labs  Lab 01/31/25 2046 02/01/25 0244 02/02/25 0452  NA 130* 130* 132*  K 4.1 3.9 4.7  CL 93* 94* 99  CO2 24 23 22   GLUCOSE 101* 93 131*  BUN 19 16 22   CREATININE 1.08 0.90 0.88  CALCIUM 9.2 8.5* 8.2*  MG  --   --  2.3  GFRNONAA >60 >60 >60  ANIONGAP 12 13 10     Recent Labs  Lab 01/31/25 2046  PROT 7.3  ALBUMIN  3.9  AST 37  ALT 11  ALKPHOS 76  BILITOT 0.9   Lipids No results for input(s): CHOL, TRIG, HDL, LABVLDL, LDLCALC, CHOLHDL in the last 168 hours.  Hematology Recent Labs  Lab 01/31/25 2046 02/01/25 0244 02/02/25 0452  WBC 12.6* 11.3* 7.7  RBC 4.20* 3.85* 3.38*  HGB 13.4 12.4* 10.7*  HCT 40.3 36.5* 32.8*  MCV 96.0 94.8 97.0  MCH 31.9 32.2 31.7  MCHC 33.3 34.0 32.6  RDW 13.3 13.3 13.2  PLT 295 288 267   Thyroid   Recent Labs  Lab 01/31/25 2227  TSH 3.060    BNPNo results for input(s): BNP, PROBNP in the last 168 hours.  DDimer No results for input(s): DDIMER in the last 168 hours.  Radiology/Studies:  No results found.   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  There was moderate asymmetric LVH in the septal segment, G2 DD.  Normal RV.  Mild AS.  Septum thickness 13 mm.  Daughter with history of HOCM status post myectomy.  Fortunately seems to be compensated and has not had any complaints or clear signs of cardiac  arrhythmias.  Euvolemic. Start Lopressor  25 mg twice daily. Discussed with MD about cardiac MRI and heart monitor.  Likely low risk given he is 84 years old and not likely to need an ICD at this point in his life.  Also telemetry reviewed here without any VT so outpatient monitor  not required currently. Encourage aggressive p.o. hydration. All children probably should get genetic testing.  He has 2 daughters and 1 son.  PVCs Frequent PVCs also in the pattern of bigeminy.  Start beta-blocker as above.  Subdural hematoma Reports 2 falls now since December resulting into traumatic subdural hematoma now status post bur hole drainage.  He has no recollection at all of why he had fallen but in context of the above concern for cardiogenic origins.  Hypertension Will continue his home nifedipine .  Reasonable to hold him overnight to see how he does on this regiment before discharge.   Risk Assessment/Risk Scores:     [place]  Heart Association (NYHA) Functional Class NYHA Class II     For questions or updates, please contact  [facility] Please consult [email or web address] for contact info under     Signed, Thom LITTIE Sluder, PA-C  02/05/2025 3:23 PM       [1]  Allergies Allergen Reactions   Sulfa Antibiotics Other (See Comments)    Unsure years ago    Lisinopril Swelling    Facial swelling   "

## 2025-02-05 NOTE — Treatment Plan (Signed)
 Occupational Therapy Treatment Patient Details Name: Mark Hall MRN: [id number] DOB: Apr 15, 1941 Today's Date: 02/05/2025   History of present illness Pt is an 84 y/o M admitted to [facility] 01/31/25 for fall in bathroom. ?LOC/head trauma. 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 Pt s/p burr hole and drainage of SDH on 02/01/25. PMHx: HTN, mild cognitive impairment, arthritis.   OT comments  Pt is making adequate progress toward goals with meeting 2 out 4 goals this session. Pt and daughter plan to d/c to SNF for short term prior to return to facility. Pt able to recall information provided from prior sessions quickly accurately showing some improvements to cognition. Recommendation for skilled inpatient follow up therapy, <3 hours/day.       If plan is discharge home, recommend the following:  A little help with walking and/or transfers;A little help with bathing/dressing/bathroom;Assistance with cooking/housework;Direct supervision/assist for medications management;Direct supervision/assist for financial management;Supervision due to cognitive status   Equipment Recommendations  Other (comment) (RW)    Recommendations for Other Services      Precautions / Restrictions Precautions Precautions: Fall Recall of Precautions/Restrictions: Impaired Restrictions Weight Bearing Restrictions Per Provider Order: No       Mobility Bed Mobility Overal bed mobility: Modified Independent                  Transfers Overall transfer level: Needs assistance Equipment used: Rolling walker (2 wheels) Transfers: Sit to/from Stand Sit to Stand: Contact guard assist           General transfer comment: cues for hand placement     Balance Overall balance assessment: Mild deficits observed, not formally tested                                         ADL either performed or assessed with clinical judgement   ADL Overall ADL's : Needs  assistance/impaired Eating/Feeding: Independent   Grooming: Wash/dry hands;Supervision/safety;Standing                   Toilet Transfer: Supervision/safety;Ambulation;Regular Toilet;Grab bars   Toileting- Clothing Manipulation and Hygiene: Contact guard assist;Sit to/from stand       Functional mobility during ADLs: Supervision/safety;Rolling walker (2 wheels)      Extremity/Trunk Assessment Upper Extremity Assessment Upper Extremity Assessment: Overall WFL for tasks assessed   Lower Extremity Assessment Lower Extremity Assessment: Defer to PT evaluation        Vision       Perception     Praxis     Communication Communication Communication: No apparent difficulties   Cognition Arousal: Alert Behavior During Therapy: WFL for tasks assessed/performed Cognition: Cognition impaired             OT - Cognition Comments: pt was able to recall room number with clues or context. pt able to navigate back to room without cues. pt sequencing grooming and meal tray without cues. pt static standing with dynamic balance                 Following commands: Impaired Following commands impaired: Follows multi-step commands with increased time      Cueing   Cueing Techniques: Verbal cues, Visual cues  Exercises      Shoulder Instructions       General Comments VSS on RA    Pertinent Vitals/ Pain       Pain Assessment  Pain Assessment: No/denies pain  Home Living                                          Prior Functioning/Environment              Frequency  Min 2X/week        Progress Toward Goals  OT Goals(current goals can now be found in the care plan section)  Progress towards OT goals: Progressing toward goals  Acute Rehab OT Goals Patient Stated Goal: to keep moving OT Goal Formulation: With patient Time For Goal Achievement: 02/17/25 Potential to Achieve Goals: Good ADL Goals Pt Will Perform Grooming: with  supervision;standing (met) Pt Will Transfer to Toilet: with supervision;ambulating;regular height toilet;grab bars (met) Additional ADL Goal #1: Pt will complete wayfinding task with less than min cues from therapist during OT sessions. Additional ADL Goal #2: Pt will carryover 2 general safety strategies during ADLs and functional mobility.  Plan      Co-evaluation                 AM-PAC OT 6 Clicks Daily Activity     Outcome Measure   Help from another person eating meals?: None Help from another person taking care of personal grooming?: A Little Help from another person toileting, which includes using toliet, bedpan, or urinal?: A Little Help from another person bathing (including washing, rinsing, drying)?: A Little Help from another person to put on and taking off regular upper body clothing?: A Little Help from another person to put on and taking off regular lower body clothing?: A Little 6 Click Score: 19    End of Session Equipment Utilized During Treatment: Gait belt;Rolling walker (2 wheels)  OT Visit Diagnosis: Unsteadiness on feet (R26.81);History of falling (Z91.81);Cognitive communication deficit (R41.841) Symptoms and signs involving cognitive functions: Nontraumatic intracerebral hemorrhage   Activity Tolerance Patient tolerated treatment well   Patient Left in chair;with call bell/phone within reach;with chair alarm set;with family/visitor present   Nurse Communication Mobility status;Precautions        Time: 8891-8868 OT Time Calculation (min): 23 min  Charges: OT General Charges $OT Visit: 1 Visit OT Treatments $Self Care/Home Management : 8-22 mins   Brynn, OTR/L  Acute Rehabilitation Services Office: [phone] .   Ely Molt 02/05/2025, 11:38 AM

## 2025-02-05 NOTE — Progress Notes (Signed)
 Patient ID: Mark Hall, male   DOB: 08-01-1941, 84 y.o.   MRN: [id number] Patient is awake and alert incisions are clean and dry on scalp.  I will need to remove his staples in the coming week on Friday I can see him as an outpatient.  He apparently had been scheduled to see Dr. Joshua as an outpatient.  Daughters concern regarding placement was addressed and I noted that he could use some additional help and I believe a skilled nursing facility is appropriate for period of time.  His stability appears to be improving slowly.

## 2025-02-05 NOTE — Discharge Summary (Signed)
 Physician Discharge Summary  Patient ID: Mark Hall MRN: [id number] DOB/AGE: 04/20/1941 84 y.o.  Admit date: 01/31/2025 Discharge date: 02/05/2025  Admission Diagnoses: Chronic subdural hematoma with cerebral compression  Discharge Diagnoses: Chronic subdural hematoma with cerebral compression Principal Problem:   Subdural hematoma (HCC) Active Problems:   Mild cognitive impairment   Hypertension   Recurrent falls   Altered mental status- confusion   Dizziness   Hyperlipidemia   Hyponatremia   Acute subdural hematoma (HCC)   Status post surgery   Discharged Condition: good  Hospital Course: Patient underwent bur hole drainage of chronic subdural hematoma on the right secondary to cerebral compression and significant shift on the CT scan.  Tolerated surgery well.  He is improving postoperatively.  He is being transferred to a skilled nursing facility for further recuperation  Consults: cardiology  Significant Diagnostic Studies: None  Treatments: surgery: See op note  Discharge Exam: Blood pressure (!) 156/87, pulse 98, temperature 98.6 F (37 C), temperature source Oral, resp. rate 20, height 5' 10 (1.778 m), weight 74.8 kg, SpO2 (!) 89%. Incisions on right side of the scalp are clean and dry.  Motor function appears intact gait is moderately wide-based but stable with the use of a walker.  Cognitive function appears to be slightly off but it is improved from preop.  Disposition:    Allergies as of 02/05/2025       Reactions   Sulfa Antibiotics Other (See Comments)   Unsure years ago   Lisinopril Swelling   Facial swelling        Medication List     TAKE these medications    acetaminophen  325 MG tablet Commonly known as: TYLENOL  Take 2 tablets (650 mg total) by mouth every 6 (six) hours as needed for mild pain (pain score 1-3) or fever (or Fever >/= 101).   amoxicillin  500 MG capsule Commonly known as: AMOXIL  Take 2,000 mg by mouth See admin  instructions. Take 4 capsules (2000 mg) by mouth 1 hour prior to dental appointments   donepezil  5 MG tablet Commonly known as: ARICEPT  Take 1 tablet (5 mg total) by mouth daily.   levETIRAcetam  500 MG tablet Commonly known as: KEPPRA  Take 1 tablet (500 mg total) by mouth daily.   Magnesium 250 MG Tabs Take 250 mg by mouth at bedtime.   Melatonin 10 MG Tabs Take 10 mg by mouth at bedtime.   NIFEdipine  30 MG 24 hr tablet Commonly known as: ADALAT  CC Take 30 mg by mouth daily.   omeprazole 40 MG capsule Commonly known as: PRILOSEC Take 40 mg by mouth in the morning.   oxyCODONE  5 MG immediate release tablet Commonly known as: Oxy IR/ROXICODONE  Take 1 tablet (5 mg total) by mouth every 4 (four) hours as needed for moderate pain (pain score 4-6).   simvastatin  10 MG tablet Commonly known as: ZOCOR  Take 10 mg by mouth every evening.   VITAMIN B12 PO Take 1 tablet by mouth in the morning.   Vitamin D3 50 MCG (2000 UT) Tabs Take 2,000 Units by mouth in the morning.         Signed: Victory PARAS [place] 02/05/2025, 9:16 AM

## 2025-02-05 NOTE — Plan of Care (Signed)
  Problem: Education: Goal: Knowledge of General Education information will improve Description: Including pain rating scale, medication(s)/side effects and non-pharmacologic comfort measures Outcome: Progressing   Problem: Health Behavior/Discharge Planning: Goal: Ability to manage health-related needs will improve Outcome: Progressing   Problem: Clinical Measurements: Goal: Respiratory complications will improve Outcome: Progressing   Problem: Activity: Goal: Risk for activity intolerance will decrease Outcome: Progressing   Problem: Pain Managment: Goal: General experience of comfort will improve and/or be controlled Outcome: Progressing   Problem: Safety: Goal: Ability to remain free from injury will improve Outcome: Progressing

## 2025-02-05 NOTE — TOC Progression Note (Addendum)
 Transition of Care [facility]) - Progression Note    Patient Details  Name: Mark Hall MRN: [id number] Date of Birth: Jul 06, 1941  Transition of [facility]) CM/SW Contact  Whitfield Mark Hall, Student-Social Work Phone Number: 02/05/2025, 11:32 AM  Clinical Narrative:     11:20 AM: MSW met with patient's daughter Blanchard) at bedside. Provided bed offer list and updated on other facilities CSW sent clinicals to and still waiting on response.   2:29 PM: MSW called patient's daughter Mark Hall back for patient SNF choice. If other 3 facilities are not available (refer to 2/5 CSW note), 2nd SNF choice is [facility]. Daughter states she will call CSW back to confirm 2nd choice after visiting facility but is agreeable to starting insurance authorization. Message sent to case manager assistant to start insurance auth.  TOC continuing to follow.  Whitfield Mark Hall, MSW Intern  Expected Discharge Plan: Skilled Nursing Facility Barriers to Discharge: Continued Medical Work up               Expected Discharge Plan and Services                                               Social Drivers of Health (SDOH) Interventions SDOH Screenings   Food Insecurity: No Food Insecurity (02/02/2025)  Housing: Unknown (02/02/2025)  Utilities: Not At Risk (02/03/2025)  Social Connections: Unknown (02/03/2025)  Tobacco Use: Low Risk (02/01/2025)  Health Literacy: Unknown (10/12/2022)   Received from [facility]    Readmission Risk Interventions     No data to display

## 2025-04-23 ENCOUNTER — Ambulatory Visit: Admitting: Physician Assistant
